# Patient Record
Sex: Female | Born: 1960 | Race: Black or African American | Hispanic: No | Marital: Single | Smoking: Former smoker
Health system: Southern US, Community
[De-identification: ages and names within clinical notes are randomized; demographics above are authoritative.]

## PROBLEM LIST (undated history)

## (undated) DIAGNOSIS — I10 Essential (primary) hypertension: Secondary | ICD-10-CM

## (undated) DIAGNOSIS — K8020 Calculus of gallbladder without cholecystitis without obstruction: Secondary | ICD-10-CM

## (undated) DIAGNOSIS — D649 Anemia, unspecified: Secondary | ICD-10-CM

## (undated) DIAGNOSIS — F410 Panic disorder [episodic paroxysmal anxiety] without agoraphobia: Secondary | ICD-10-CM

## (undated) DIAGNOSIS — E119 Type 2 diabetes mellitus without complications: Secondary | ICD-10-CM

## (undated) HISTORY — DX: Panic disorder (episodic paroxysmal anxiety): F41.0

## (undated) HISTORY — PX: OTHER SURGICAL HISTORY: SHX169

## (undated) HISTORY — DX: Anemia, unspecified: D64.9

---

## 1997-08-02 ENCOUNTER — Ambulatory Visit (HOSPITAL_COMMUNITY): Admission: RE | Admit: 1997-08-02 | Discharge: 1997-08-02 | Payer: Self-pay | Admitting: Emergency Medicine

## 1998-06-25 ENCOUNTER — Encounter: Payer: Self-pay | Admitting: Emergency Medicine

## 1998-06-25 ENCOUNTER — Emergency Department (HOSPITAL_COMMUNITY): Admission: EM | Admit: 1998-06-25 | Discharge: 1998-06-25 | Payer: Self-pay | Admitting: Emergency Medicine

## 1998-07-29 ENCOUNTER — Encounter: Payer: Self-pay | Admitting: Emergency Medicine

## 1998-07-29 ENCOUNTER — Emergency Department (HOSPITAL_COMMUNITY): Admission: EM | Admit: 1998-07-29 | Discharge: 1998-07-29 | Payer: Self-pay | Admitting: Emergency Medicine

## 1998-12-11 ENCOUNTER — Emergency Department (HOSPITAL_COMMUNITY): Admission: EM | Admit: 1998-12-11 | Discharge: 1998-12-12 | Payer: Self-pay | Admitting: Emergency Medicine

## 1998-12-12 ENCOUNTER — Encounter: Payer: Self-pay | Admitting: Emergency Medicine

## 2002-11-22 ENCOUNTER — Ambulatory Visit (HOSPITAL_COMMUNITY): Admission: RE | Admit: 2002-11-22 | Discharge: 2002-11-22 | Payer: Self-pay | Admitting: Family Medicine

## 2002-11-22 ENCOUNTER — Encounter: Payer: Self-pay | Admitting: Occupational Therapy

## 2003-02-04 ENCOUNTER — Ambulatory Visit (HOSPITAL_COMMUNITY): Admission: RE | Admit: 2003-02-04 | Discharge: 2003-02-04 | Payer: Self-pay | Admitting: Family Medicine

## 2003-02-04 ENCOUNTER — Encounter: Payer: Self-pay | Admitting: Family Medicine

## 2003-03-16 ENCOUNTER — Ambulatory Visit (HOSPITAL_COMMUNITY): Admission: RE | Admit: 2003-03-16 | Discharge: 2003-03-16 | Payer: Self-pay | Admitting: Family Medicine

## 2004-04-09 ENCOUNTER — Ambulatory Visit: Payer: Self-pay | Admitting: Family Medicine

## 2004-10-22 ENCOUNTER — Ambulatory Visit: Payer: Self-pay | Admitting: Family Medicine

## 2004-10-23 ENCOUNTER — Ambulatory Visit: Payer: Self-pay | Admitting: *Deleted

## 2004-12-01 ENCOUNTER — Ambulatory Visit: Payer: Self-pay | Admitting: Family Medicine

## 2004-12-03 ENCOUNTER — Ambulatory Visit: Payer: Self-pay | Admitting: Family Medicine

## 2004-12-29 ENCOUNTER — Ambulatory Visit: Payer: Self-pay | Admitting: Family Medicine

## 2004-12-31 ENCOUNTER — Ambulatory Visit (HOSPITAL_COMMUNITY): Admission: RE | Admit: 2004-12-31 | Discharge: 2004-12-31 | Payer: Self-pay | Admitting: Family Medicine

## 2005-04-22 ENCOUNTER — Ambulatory Visit: Payer: Self-pay | Admitting: Family Medicine

## 2005-07-22 ENCOUNTER — Ambulatory Visit: Payer: Self-pay | Admitting: Family Medicine

## 2005-11-12 ENCOUNTER — Ambulatory Visit: Payer: Self-pay | Admitting: Family Medicine

## 2006-01-10 ENCOUNTER — Encounter (INDEPENDENT_AMBULATORY_CARE_PROVIDER_SITE_OTHER): Payer: Self-pay | Admitting: Family Medicine

## 2006-01-10 LAB — CONVERTED CEMR LAB: Pap Smear: NORMAL

## 2006-01-31 ENCOUNTER — Ambulatory Visit: Payer: Self-pay | Admitting: Family Medicine

## 2006-02-02 ENCOUNTER — Ambulatory Visit (HOSPITAL_COMMUNITY): Admission: RE | Admit: 2006-02-02 | Discharge: 2006-02-02 | Payer: Self-pay | Admitting: Internal Medicine

## 2006-02-02 ENCOUNTER — Encounter (INDEPENDENT_AMBULATORY_CARE_PROVIDER_SITE_OTHER): Payer: Self-pay | Admitting: Family Medicine

## 2006-02-02 LAB — CONVERTED CEMR LAB: Pap Smear: NORMAL

## 2006-02-03 ENCOUNTER — Ambulatory Visit: Payer: Self-pay | Admitting: Family Medicine

## 2006-12-22 ENCOUNTER — Encounter (INDEPENDENT_AMBULATORY_CARE_PROVIDER_SITE_OTHER): Payer: Self-pay | Admitting: Family Medicine

## 2006-12-22 DIAGNOSIS — L259 Unspecified contact dermatitis, unspecified cause: Secondary | ICD-10-CM | POA: Insufficient documentation

## 2006-12-22 DIAGNOSIS — R252 Cramp and spasm: Secondary | ICD-10-CM | POA: Insufficient documentation

## 2006-12-22 DIAGNOSIS — K649 Unspecified hemorrhoids: Secondary | ICD-10-CM | POA: Insufficient documentation

## 2006-12-28 ENCOUNTER — Encounter (INDEPENDENT_AMBULATORY_CARE_PROVIDER_SITE_OTHER): Payer: Self-pay | Admitting: *Deleted

## 2007-01-31 ENCOUNTER — Telehealth (INDEPENDENT_AMBULATORY_CARE_PROVIDER_SITE_OTHER): Payer: Self-pay | Admitting: Family Medicine

## 2007-02-08 ENCOUNTER — Telehealth (INDEPENDENT_AMBULATORY_CARE_PROVIDER_SITE_OTHER): Payer: Self-pay | Admitting: *Deleted

## 2007-03-20 ENCOUNTER — Ambulatory Visit: Payer: Self-pay | Admitting: Family Medicine

## 2007-03-20 ENCOUNTER — Encounter (INDEPENDENT_AMBULATORY_CARE_PROVIDER_SITE_OTHER): Payer: Self-pay | Admitting: Family Medicine

## 2007-03-20 LAB — CONVERTED CEMR LAB
Bilirubin Urine: NEGATIVE
Blood in Urine, dipstick: NEGATIVE
Chlamydia, DNA Probe: NEGATIVE
GC Probe Amp, Genital: NEGATIVE
Glucose, Urine, Semiquant: NEGATIVE
Ketones, urine, test strip: NEGATIVE
Nitrite: NEGATIVE
Protein, U semiquant: NEGATIVE
Specific Gravity, Urine: 1.01
Urobilinogen, UA: NEGATIVE
WBC Urine, dipstick: NEGATIVE
pH: 5

## 2007-03-21 LAB — CONVERTED CEMR LAB: Pap Smear: NORMAL

## 2007-03-23 ENCOUNTER — Ambulatory Visit (HOSPITAL_COMMUNITY): Admission: RE | Admit: 2007-03-23 | Discharge: 2007-03-23 | Payer: Self-pay | Admitting: Family Medicine

## 2007-03-23 ENCOUNTER — Encounter (INDEPENDENT_AMBULATORY_CARE_PROVIDER_SITE_OTHER): Payer: Self-pay | Admitting: Family Medicine

## 2007-03-28 ENCOUNTER — Encounter (INDEPENDENT_AMBULATORY_CARE_PROVIDER_SITE_OTHER): Payer: Self-pay | Admitting: Family Medicine

## 2007-03-28 DIAGNOSIS — D509 Iron deficiency anemia, unspecified: Secondary | ICD-10-CM | POA: Insufficient documentation

## 2007-03-28 LAB — CONVERTED CEMR LAB
ALT: 14 units/L (ref 0–35)
AST: 14 units/L (ref 0–37)
Albumin: 3.9 g/dL (ref 3.5–5.2)
Alkaline Phosphatase: 61 units/L (ref 39–117)
BUN: 13 mg/dL (ref 6–23)
Basophils Absolute: 0 10*3/uL (ref 0.0–0.1)
Basophils Relative: 0 % (ref 0–1)
CO2: 25 meq/L (ref 19–32)
Calcium: 9.6 mg/dL (ref 8.4–10.5)
Chloride: 102 meq/L (ref 96–112)
Cholesterol: 194 mg/dL (ref 0–200)
Creatinine, Ser: 0.89 mg/dL (ref 0.40–1.20)
Eosinophils Absolute: 0.1 10*3/uL — ABNORMAL LOW (ref 0.2–0.7)
Eosinophils Relative: 1 % (ref 0–5)
Glucose, Bld: 78 mg/dL (ref 70–99)
HCT: 35.7 % — ABNORMAL LOW (ref 36.0–46.0)
HDL: 76 mg/dL (ref >39)
Hemoglobin: 11.1 g/dL — ABNORMAL LOW (ref 12.0–15.0)
LDL Cholesterol: 82 mg/dL (ref 0–99)
Lymphocytes Relative: 54 % — ABNORMAL HIGH (ref 12–46)
Lymphs Abs: 4.4 10*3/uL — ABNORMAL HIGH (ref 0.7–4.0)
MCHC: 31.1 g/dL (ref 30.0–36.0)
MCV: 87.7 fL (ref 78.0–100.0)
Monocytes Absolute: 0.4 10*3/uL (ref 0.1–1.0)
Monocytes Relative: 5 % (ref 3–12)
Neutro Abs: 3.3 10*3/uL (ref 1.7–7.7)
Neutrophils Relative %: 40 % — ABNORMAL LOW (ref 43–77)
Platelets: 345 10*3/uL (ref 150–400)
Potassium: 3.8 meq/L (ref 3.5–5.3)
RBC: 4.07 M/uL (ref 3.87–5.11)
RDW: 13.6 % (ref 11.5–15.5)
Sodium: 138 meq/L (ref 135–145)
TSH: 3.596 microintl units/mL (ref 0.350–5.50)
Total Bilirubin: 0.3 mg/dL (ref 0.3–1.2)
Total CHOL/HDL Ratio: 2.6
Total Protein: 7.6 g/dL (ref 6.0–8.3)
Triglycerides: 179 mg/dL — ABNORMAL HIGH (ref <150)
VLDL: 36 mg/dL (ref 0–40)
WBC: 8.3 10*3/uL (ref 4.0–10.5)

## 2007-04-23 ENCOUNTER — Encounter (INDEPENDENT_AMBULATORY_CARE_PROVIDER_SITE_OTHER): Payer: Self-pay | Admitting: Family Medicine

## 2007-08-14 ENCOUNTER — Telehealth (INDEPENDENT_AMBULATORY_CARE_PROVIDER_SITE_OTHER): Payer: Self-pay | Admitting: Family Medicine

## 2007-10-02 ENCOUNTER — Telehealth (INDEPENDENT_AMBULATORY_CARE_PROVIDER_SITE_OTHER): Payer: Self-pay | Admitting: Family Medicine

## 2008-02-07 ENCOUNTER — Ambulatory Visit: Payer: Self-pay | Admitting: Nurse Practitioner

## 2008-02-07 DIAGNOSIS — R209 Unspecified disturbances of skin sensation: Secondary | ICD-10-CM | POA: Insufficient documentation

## 2008-02-07 DIAGNOSIS — I10 Essential (primary) hypertension: Secondary | ICD-10-CM

## 2008-02-08 ENCOUNTER — Ambulatory Visit (HOSPITAL_COMMUNITY): Admission: RE | Admit: 2008-02-08 | Discharge: 2008-02-08 | Payer: Self-pay | Admitting: Family Medicine

## 2008-02-13 ENCOUNTER — Ambulatory Visit (HOSPITAL_COMMUNITY): Admission: RE | Admit: 2008-02-13 | Discharge: 2008-02-13 | Payer: Self-pay | Admitting: Internal Medicine

## 2008-02-13 DIAGNOSIS — I6789 Other cerebrovascular disease: Secondary | ICD-10-CM

## 2008-02-19 ENCOUNTER — Telehealth (INDEPENDENT_AMBULATORY_CARE_PROVIDER_SITE_OTHER): Payer: Self-pay | Admitting: Nurse Practitioner

## 2008-02-21 ENCOUNTER — Ambulatory Visit: Payer: Self-pay | Admitting: Nurse Practitioner

## 2008-02-23 ENCOUNTER — Ambulatory Visit: Payer: Self-pay | Admitting: Family Medicine

## 2008-02-28 ENCOUNTER — Ambulatory Visit: Payer: Self-pay | Admitting: Vascular Surgery

## 2008-02-28 ENCOUNTER — Encounter (INDEPENDENT_AMBULATORY_CARE_PROVIDER_SITE_OTHER): Payer: Self-pay | Admitting: Family Medicine

## 2008-02-28 ENCOUNTER — Ambulatory Visit (HOSPITAL_COMMUNITY): Admission: RE | Admit: 2008-02-28 | Discharge: 2008-02-28 | Payer: Self-pay | Admitting: Family Medicine

## 2008-02-28 ENCOUNTER — Encounter (INDEPENDENT_AMBULATORY_CARE_PROVIDER_SITE_OTHER): Payer: Self-pay | Admitting: Ophthalmology

## 2008-03-06 ENCOUNTER — Ambulatory Visit: Payer: Self-pay | Admitting: Nurse Practitioner

## 2008-03-11 ENCOUNTER — Telehealth (INDEPENDENT_AMBULATORY_CARE_PROVIDER_SITE_OTHER): Payer: Self-pay | Admitting: Family Medicine

## 2008-03-13 ENCOUNTER — Telehealth (INDEPENDENT_AMBULATORY_CARE_PROVIDER_SITE_OTHER): Payer: Self-pay | Admitting: Nurse Practitioner

## 2008-04-01 ENCOUNTER — Ambulatory Visit: Payer: Self-pay | Admitting: Family Medicine

## 2008-04-01 DIAGNOSIS — I10 Essential (primary) hypertension: Secondary | ICD-10-CM | POA: Insufficient documentation

## 2008-04-01 LAB — CONVERTED CEMR LAB
ALT: 15 units/L (ref 0–35)
AST: 17 units/L (ref 0–37)
Albumin: 4 g/dL (ref 3.5–5.2)
Alkaline Phosphatase: 59 units/L (ref 39–117)
BUN: 19 mg/dL (ref 6–23)
Basophils Absolute: 0 10*3/uL (ref 0.0–0.1)
Basophils Relative: 0 % (ref 0–1)
CO2: 23 meq/L (ref 19–32)
Calcium: 9.6 mg/dL (ref 8.4–10.5)
Chloride: 98 meq/L (ref 96–112)
Cholesterol: 204 mg/dL — ABNORMAL HIGH (ref 0–200)
Creatinine, Ser: 1.18 mg/dL (ref 0.40–1.20)
Eosinophils Absolute: 0.2 10*3/uL (ref 0.0–0.7)
Eosinophils Relative: 3 % (ref 0–5)
Glucose, Bld: 110 mg/dL — ABNORMAL HIGH (ref 70–99)
HCT: 35.2 % — ABNORMAL LOW (ref 36.0–46.0)
HDL: 69 mg/dL (ref >39)
Hemoglobin: 11.1 g/dL — ABNORMAL LOW (ref 12.0–15.0)
LDL Cholesterol: 83 mg/dL (ref 0–99)
Lymphocytes Relative: 48 % — ABNORMAL HIGH (ref 12–46)
Lymphs Abs: 2.8 10*3/uL (ref 0.7–4.0)
MCHC: 31.5 g/dL (ref 30.0–36.0)
MCV: 87.3 fL (ref 78.0–100.0)
Monocytes Absolute: 0.4 10*3/uL (ref 0.1–1.0)
Monocytes Relative: 7 % (ref 3–12)
Neutro Abs: 2.5 10*3/uL (ref 1.7–7.7)
Neutrophils Relative %: 42 % — ABNORMAL LOW (ref 43–77)
Platelets: 356 10*3/uL (ref 150–400)
Potassium: 4.1 meq/L (ref 3.5–5.3)
RBC: 4.03 M/uL (ref 3.87–5.11)
RDW: 13 % (ref 11.5–15.5)
Sodium: 137 meq/L (ref 135–145)
TSH: 1.315 microintl units/mL (ref 0.350–4.50)
Total Bilirubin: 0.3 mg/dL (ref 0.3–1.2)
Total CHOL/HDL Ratio: 3
Total Protein: 7.8 g/dL (ref 6.0–8.3)
Triglycerides: 261 mg/dL — ABNORMAL HIGH (ref <150)
VLDL: 52 mg/dL — ABNORMAL HIGH (ref 0–40)
WBC: 5.9 10*3/uL (ref 4.0–10.5)

## 2008-04-02 ENCOUNTER — Telehealth (INDEPENDENT_AMBULATORY_CARE_PROVIDER_SITE_OTHER): Payer: Self-pay | Admitting: *Deleted

## 2008-04-03 ENCOUNTER — Telehealth (INDEPENDENT_AMBULATORY_CARE_PROVIDER_SITE_OTHER): Payer: Self-pay | Admitting: Nurse Practitioner

## 2008-04-10 ENCOUNTER — Ambulatory Visit (HOSPITAL_COMMUNITY): Admission: RE | Admit: 2008-04-10 | Discharge: 2008-04-10 | Payer: Self-pay | Admitting: Family Medicine

## 2008-04-10 ENCOUNTER — Ambulatory Visit: Payer: Self-pay | Admitting: Cardiology

## 2008-04-10 ENCOUNTER — Encounter (INDEPENDENT_AMBULATORY_CARE_PROVIDER_SITE_OTHER): Payer: Self-pay | Admitting: Ophthalmology

## 2008-04-30 ENCOUNTER — Ambulatory Visit: Payer: Self-pay | Admitting: Family Medicine

## 2008-04-30 ENCOUNTER — Encounter (INDEPENDENT_AMBULATORY_CARE_PROVIDER_SITE_OTHER): Payer: Self-pay | Admitting: Family Medicine

## 2008-04-30 LAB — CONVERTED CEMR LAB
Bilirubin Urine: NEGATIVE
Blood in Urine, dipstick: NEGATIVE
Glucose, Urine, Semiquant: NEGATIVE
Ketones, urine, test strip: NEGATIVE
Nitrite: NEGATIVE
Protein, U semiquant: NEGATIVE
Specific Gravity, Urine: 1.01
Urobilinogen, UA: 1
pH: 7

## 2008-05-01 ENCOUNTER — Encounter (INDEPENDENT_AMBULATORY_CARE_PROVIDER_SITE_OTHER): Payer: Self-pay | Admitting: Family Medicine

## 2008-05-01 LAB — CONVERTED CEMR LAB
Anti Nuclear Antibody(ANA): NEGATIVE
AntiThromb III Func: 109 % (ref 76–126)
Anticardiolipin IgA: <7 (ref <13)
Anticardiolipin IgG: <7 (ref <11)
Anticardiolipin IgM: <7 (ref <10)
Homocysteine: 9.2 micromoles/L (ref 4.0–15.4)

## 2008-05-03 ENCOUNTER — Telehealth (INDEPENDENT_AMBULATORY_CARE_PROVIDER_SITE_OTHER): Payer: Self-pay | Admitting: Family Medicine

## 2008-05-07 ENCOUNTER — Ambulatory Visit (HOSPITAL_COMMUNITY): Admission: RE | Admit: 2008-05-07 | Discharge: 2008-05-07 | Payer: Self-pay | Admitting: Family Medicine

## 2008-05-08 ENCOUNTER — Ambulatory Visit: Payer: Self-pay | Admitting: Family Medicine

## 2008-05-09 ENCOUNTER — Encounter (INDEPENDENT_AMBULATORY_CARE_PROVIDER_SITE_OTHER): Payer: Self-pay | Admitting: Family Medicine

## 2008-05-09 LAB — CONVERTED CEMR LAB: Protein S Ag, Total: 106 % (ref 70–140)

## 2008-05-14 ENCOUNTER — Encounter (INDEPENDENT_AMBULATORY_CARE_PROVIDER_SITE_OTHER): Payer: Self-pay | Admitting: Family Medicine

## 2008-07-04 ENCOUNTER — Ambulatory Visit: Payer: Self-pay | Admitting: Obstetrics and Gynecology

## 2008-08-13 ENCOUNTER — Ambulatory Visit: Payer: Self-pay | Admitting: Family Medicine

## 2008-12-26 ENCOUNTER — Ambulatory Visit: Payer: Self-pay | Admitting: Internal Medicine

## 2008-12-26 ENCOUNTER — Encounter (INDEPENDENT_AMBULATORY_CARE_PROVIDER_SITE_OTHER): Payer: Self-pay | Admitting: *Deleted

## 2008-12-26 DIAGNOSIS — E669 Obesity, unspecified: Secondary | ICD-10-CM

## 2008-12-26 LAB — CONVERTED CEMR LAB
Bilirubin Urine: NEGATIVE
Blood in Urine, dipstick: NEGATIVE
Glucose, Urine, Semiquant: NEGATIVE
Ketones, urine, test strip: NEGATIVE
Nitrite: NEGATIVE
Protein, U semiquant: NEGATIVE
Specific Gravity, Urine: >=1.03
Urobilinogen, UA: 0.2
WBC Urine, dipstick: NEGATIVE
pH: 5

## 2008-12-27 ENCOUNTER — Ambulatory Visit: Payer: Self-pay | Admitting: Internal Medicine

## 2008-12-30 ENCOUNTER — Ambulatory Visit: Payer: Self-pay | Admitting: Nurse Practitioner

## 2009-01-05 ENCOUNTER — Telehealth (INDEPENDENT_AMBULATORY_CARE_PROVIDER_SITE_OTHER): Payer: Self-pay | Admitting: Internal Medicine

## 2009-01-08 LAB — CONVERTED CEMR LAB
ALT: 22 units/L (ref 0–35)
AST: 23 units/L (ref 0–37)
Albumin: 4.1 g/dL (ref 3.5–5.2)
Alkaline Phosphatase: 66 units/L (ref 39–117)
BUN: 13 mg/dL (ref 6–23)
Basophils Absolute: 0 10*3/uL (ref 0.0–0.1)
Basophils Relative: 1 % (ref 0–1)
CO2: 21 meq/L (ref 19–32)
Calcium: 9.3 mg/dL (ref 8.4–10.5)
Chloride: 101 meq/L (ref 96–112)
Cholesterol: 173 mg/dL (ref 0–200)
Creatinine, Ser: 0.99 mg/dL (ref 0.40–1.20)
Eosinophils Absolute: 0.1 10*3/uL (ref 0.0–0.7)
Eosinophils Relative: 2 % (ref 0–5)
Glucose, Bld: 89 mg/dL (ref 70–99)
HCT: 36 % (ref 36.0–46.0)
HDL: 59 mg/dL (ref >39)
Hemoglobin: 11.4 g/dL — ABNORMAL LOW (ref 12.0–15.0)
LDL Cholesterol: 87 mg/dL (ref 0–99)
Lymphocytes Relative: 45 % (ref 12–46)
Lymphs Abs: 2.4 10*3/uL (ref 0.7–4.0)
MCHC: 31.7 g/dL (ref 30.0–36.0)
MCV: 92.8 fL (ref 78.0–100.0)
Monocytes Absolute: 0.3 10*3/uL (ref 0.1–1.0)
Monocytes Relative: 5 % (ref 3–12)
Neutro Abs: 2.6 10*3/uL (ref 1.7–7.7)
Neutrophils Relative %: 47 % (ref 43–77)
Platelets: 301 10*3/uL (ref 150–400)
Potassium: 3.6 meq/L (ref 3.5–5.3)
RBC: 3.88 M/uL (ref 3.87–5.11)
RDW: 13.7 % (ref 11.5–15.5)
Sodium: 140 meq/L (ref 135–145)
Total Bilirubin: 0.4 mg/dL (ref 0.3–1.2)
Total CHOL/HDL Ratio: 2.9
Total Protein: 7.4 g/dL (ref 6.0–8.3)
Triglycerides: 137 mg/dL (ref <150)
VLDL: 27 mg/dL (ref 0–40)
WBC: 5.4 10*3/uL (ref 4.0–10.5)

## 2009-03-03 ENCOUNTER — Telehealth (INDEPENDENT_AMBULATORY_CARE_PROVIDER_SITE_OTHER): Payer: Self-pay | Admitting: Internal Medicine

## 2009-04-11 ENCOUNTER — Emergency Department (HOSPITAL_COMMUNITY): Admission: EM | Admit: 2009-04-11 | Discharge: 2009-04-11 | Payer: Self-pay | Admitting: Emergency Medicine

## 2009-04-11 DIAGNOSIS — J13 Pneumonia due to Streptococcus pneumoniae: Secondary | ICD-10-CM | POA: Insufficient documentation

## 2009-04-14 ENCOUNTER — Ambulatory Visit: Payer: Self-pay | Admitting: Nurse Practitioner

## 2009-04-19 ENCOUNTER — Ambulatory Visit (HOSPITAL_COMMUNITY): Admission: RE | Admit: 2009-04-19 | Discharge: 2009-04-19 | Payer: Self-pay | Admitting: Internal Medicine

## 2009-05-01 ENCOUNTER — Ambulatory Visit: Payer: Self-pay | Admitting: Internal Medicine

## 2009-05-01 DIAGNOSIS — B373 Candidiasis of vulva and vagina: Secondary | ICD-10-CM

## 2009-05-01 LAB — CONVERTED CEMR LAB
KOH Prep: NEGATIVE
Pap Smear: NEGATIVE
Rapid HIV Screen: NEGATIVE
Whiff Test: NEGATIVE

## 2009-05-15 LAB — CONVERTED CEMR LAB
ALT: 16 units/L (ref 0–35)
AST: 19 units/L (ref 0–37)
Albumin: 4.1 g/dL (ref 3.5–5.2)
Alkaline Phosphatase: 71 units/L (ref 39–117)
BUN: 16 mg/dL (ref 6–23)
Basophils Absolute: 0 10*3/uL (ref 0.0–0.1)
Basophils Relative: 0 % (ref 0–1)
CO2: 24 meq/L (ref 19–32)
Calcium: 9.7 mg/dL (ref 8.4–10.5)
Chlamydia, DNA Probe: NEGATIVE
Chloride: 104 meq/L (ref 96–112)
Cholesterol: 171 mg/dL (ref 0–200)
Creatinine, Ser: 0.84 mg/dL (ref 0.40–1.20)
Eosinophils Absolute: 0.2 10*3/uL (ref 0.0–0.7)
Eosinophils Relative: 4 % (ref 0–5)
GC Probe Amp, Genital: NEGATIVE
Glucose, Bld: 99 mg/dL (ref 70–99)
HCT: 33.7 % — ABNORMAL LOW (ref 36.0–46.0)
HDL: 59 mg/dL (ref >39)
Hemoglobin: 10.1 g/dL — ABNORMAL LOW (ref 12.0–15.0)
LDL Cholesterol: 84 mg/dL (ref 0–99)
Lymphocytes Relative: 59 % — ABNORMAL HIGH (ref 12–46)
Lymphs Abs: 2.9 10*3/uL (ref 0.7–4.0)
MCHC: 30 g/dL (ref 30.0–36.0)
MCV: 91.3 fL (ref 78.0–100.0)
Monocytes Absolute: 0.4 10*3/uL (ref 0.1–1.0)
Monocytes Relative: 8 % (ref 3–12)
Neutro Abs: 1.4 10*3/uL — ABNORMAL LOW (ref 1.7–7.7)
Neutrophils Relative %: 29 % — ABNORMAL LOW (ref 43–77)
Platelets: 299 10*3/uL (ref 150–400)
Potassium: 4.7 meq/L (ref 3.5–5.3)
RBC: 3.69 M/uL — ABNORMAL LOW (ref 3.87–5.11)
RDW: 14.6 % (ref 11.5–15.5)
Sodium: 140 meq/L (ref 135–145)
Total Bilirubin: 0.3 mg/dL (ref 0.3–1.2)
Total CHOL/HDL Ratio: 2.9
Total Protein: 7.2 g/dL (ref 6.0–8.3)
Triglycerides: 140 mg/dL (ref <150)
VLDL: 28 mg/dL (ref 0–40)
WBC: 4.9 10*3/uL (ref 4.0–10.5)

## 2009-05-21 ENCOUNTER — Ambulatory Visit (HOSPITAL_COMMUNITY): Admission: RE | Admit: 2009-05-21 | Discharge: 2009-05-21 | Payer: Self-pay | Admitting: Internal Medicine

## 2009-05-27 ENCOUNTER — Encounter (INDEPENDENT_AMBULATORY_CARE_PROVIDER_SITE_OTHER): Payer: Self-pay | Admitting: *Deleted

## 2009-06-04 ENCOUNTER — Telehealth (INDEPENDENT_AMBULATORY_CARE_PROVIDER_SITE_OTHER): Payer: Self-pay | Admitting: Internal Medicine

## 2009-06-13 ENCOUNTER — Ambulatory Visit: Payer: Self-pay | Admitting: Internal Medicine

## 2009-06-13 LAB — CONVERTED CEMR LAB
Folate: >20 ng/mL
Iron: 68 ug/dL (ref 42–145)
Saturation Ratios: 24 % (ref 20–55)
TIBC: 289 ug/dL (ref 250–470)
UIBC: 221 ug/dL
Vitamin B-12: 818 pg/mL (ref 211–911)

## 2009-06-25 ENCOUNTER — Encounter (INDEPENDENT_AMBULATORY_CARE_PROVIDER_SITE_OTHER): Payer: Self-pay | Admitting: *Deleted

## 2009-10-28 ENCOUNTER — Ambulatory Visit: Payer: Self-pay | Admitting: Internal Medicine

## 2009-10-30 ENCOUNTER — Ambulatory Visit (HOSPITAL_COMMUNITY): Admission: RE | Admit: 2009-10-30 | Discharge: 2009-10-30 | Payer: Self-pay | Admitting: Internal Medicine

## 2009-10-31 ENCOUNTER — Telehealth (INDEPENDENT_AMBULATORY_CARE_PROVIDER_SITE_OTHER): Payer: Self-pay | Admitting: Internal Medicine

## 2009-12-18 ENCOUNTER — Ambulatory Visit: Payer: Self-pay | Admitting: Internal Medicine

## 2010-02-25 LAB — CONVERTED CEMR LAB
ALT: 24 units/L (ref 0–35)
AST: 22 units/L (ref 0–37)
Albumin: 4.2 g/dL (ref 3.5–5.2)
Alkaline Phosphatase: 65 units/L (ref 39–117)
Basophils Absolute: 0 10*3/uL (ref 0.0–0.1)
Basophils Relative: 1 % (ref 0–1)
Bilirubin, Direct: 0.1 mg/dL (ref 0.0–0.3)
Cholesterol: 151 mg/dL (ref 0–200)
Eosinophils Absolute: 0.1 10*3/uL (ref 0.0–0.7)
Eosinophils Relative: 2 % (ref 0–5)
HCT: 35 % — ABNORMAL LOW (ref 36.0–46.0)
HDL: 49 mg/dL (ref >39)
Hemoglobin: 10.8 g/dL — ABNORMAL LOW (ref 12.0–15.0)
Indirect Bilirubin: 0.4 mg/dL (ref 0.0–0.9)
LDL Cholesterol: 60 mg/dL (ref 0–99)
Lymphocytes Relative: 52 % — ABNORMAL HIGH (ref 12–46)
Lymphs Abs: 2.3 10*3/uL (ref 0.7–4.0)
MCHC: 30.9 g/dL (ref 30.0–36.0)
MCV: 90.4 fL (ref 78.0–100.0)
Monocytes Absolute: 0.3 10*3/uL (ref 0.1–1.0)
Monocytes Relative: 6 % (ref 3–12)
Neutro Abs: 1.8 10*3/uL (ref 1.7–7.7)
Neutrophils Relative %: 40 % — ABNORMAL LOW (ref 43–77)
Platelets: 249 10*3/uL (ref 150–400)
RBC: 3.87 M/uL (ref 3.87–5.11)
RDW: 14.6 % (ref 11.5–15.5)
Total Bilirubin: 0.5 mg/dL (ref 0.3–1.2)
Total CHOL/HDL Ratio: 3.1
Total Protein: 7.3 g/dL (ref 6.0–8.3)
Triglycerides: 208 mg/dL — ABNORMAL HIGH (ref <150)
VLDL: 42 mg/dL — ABNORMAL HIGH (ref 0–40)
WBC: 4.4 10*3/uL (ref 4.0–10.5)

## 2010-04-14 ENCOUNTER — Encounter (INDEPENDENT_AMBULATORY_CARE_PROVIDER_SITE_OTHER): Payer: Self-pay | Admitting: Internal Medicine

## 2010-04-14 ENCOUNTER — Ambulatory Visit
Admission: RE | Admit: 2010-04-14 | Discharge: 2010-04-14 | Payer: Self-pay | Source: Home / Self Care | Attending: Internal Medicine | Admitting: Internal Medicine

## 2010-04-14 LAB — CONVERTED CEMR LAB
Basophils Absolute: 0 10*3/uL (ref 0.0–0.1)
CO2: 29 meq/L (ref 19–32)
Chlamydia, DNA Probe: NEGATIVE
Chloride: 100 meq/L (ref 96–112)
GC Probe Amp, Genital: NEGATIVE
HDL: 40 mg/dL (ref >39)
HIV: NONREACTIVE
Hemoglobin: 10.2 g/dL — ABNORMAL LOW (ref 12.0–15.0)
KOH Prep: NEGATIVE
LDL Cholesterol: 66 mg/dL (ref 0–99)
Lymphocytes Relative: 45 % (ref 12–46)
Lymphs Abs: 2.7 10*3/uL (ref 0.7–4.0)
Monocytes Absolute: 0.4 10*3/uL (ref 0.1–1.0)
Neutro Abs: 2.8 10*3/uL (ref 1.7–7.7)
Platelets: 262 10*3/uL (ref 150–400)
RDW: 14.7 % (ref 11.5–15.5)
Sodium: 138 meq/L (ref 135–145)
Total CHOL/HDL Ratio: 3.5
WBC: 6 10*3/uL (ref 4.0–10.5)
Whiff Test: NEGATIVE

## 2010-04-29 ENCOUNTER — Ambulatory Visit
Admission: RE | Admit: 2010-04-29 | Discharge: 2010-04-29 | Payer: Self-pay | Source: Home / Self Care | Attending: Internal Medicine | Admitting: Internal Medicine

## 2010-05-01 ENCOUNTER — Other Ambulatory Visit (HOSPITAL_COMMUNITY): Payer: Self-pay | Admitting: Internal Medicine

## 2010-05-01 DIAGNOSIS — Z1231 Encounter for screening mammogram for malignant neoplasm of breast: Secondary | ICD-10-CM

## 2010-05-01 DIAGNOSIS — Z0000 Encounter for general adult medical examination without abnormal findings: Secondary | ICD-10-CM

## 2010-05-02 ENCOUNTER — Encounter: Payer: Self-pay | Admitting: Family Medicine

## 2010-05-04 ENCOUNTER — Telehealth (INDEPENDENT_AMBULATORY_CARE_PROVIDER_SITE_OTHER): Payer: Self-pay | Admitting: Internal Medicine

## 2010-05-12 NOTE — Progress Notes (Signed)
Summary: Office Visit/DEPRESSION SCREENING  Office Visit/DEPRESSION SCREENING   Imported By: Arta Bruce 06/06/2009 12:02:32  _____________________________________________________________________  External Attachment:    Type:   Image     Comment:   External Document

## 2010-05-12 NOTE — Letter (Signed)
Summary: *HSN Results Follow up  [facility]  [address] [place], [place] [address]   Phone: [phone]  Fax: [phone]      06/25/2009   Erika Nielsen [address] [place], [place]  [address]   Dear  [name],                            ____S.Drinkard,FNP   ____D. Gore,FNP       ____B. McPherson,MD   ____V. Rankins,MD    __x__E. Mulberry,MD    ____N. Daphine Deutscher, FNP  ____D. Reche Dixon, MD    ____K. Philipp Deputy, MD    ____Other     This letter is to inform you that your recent test(s):  _______Pap Smear    _______Lab Test     _______X-ray    _______ is within acceptable limits  _______ requires a medication change  _______ requires a follow-up lab visit  _______ requires a follow-up visit with your provider   Comments:  We have tried to reach you several times, please give our office a call at your earliest convience.  Thank you.       _________________________________________________________ If you have any questions, please contact our office                     Sincerely,  Vesta Mixer CMA [facility]

## 2010-05-12 NOTE — Letter (Signed)
Summary: *Referral Letter  [facility]  [address] [place], [place] [address]   Phone: [phone]  Fax: [phone]    05/01/2009  Thank you in advance for agreeing to see my patient:  Erika Nielsen [address] [place], [place]  [address]  Phone: [phone]  Reason for Referral: Possible mass in posterior rectum at tip of digital exam--felt at least submucosal.  Erika Nielsen had difficulties complying with exam--made her very uncomfortable.  Stool was Guaiac negative.  Procedures Requested: ?Flexible sigmoidoscopy  Current Medical Problems: 1)  POSSIBLE RECTAL MASS (ICD-787.99) 2)  HEALTH MAINTENANCE EXAM (ICD-V70.0) 3)  ROUTINE GYNECOLOGICAL EXAMINATION (ICD-V72.31) 4)  PNEUMONIA, COMMUNITY ACQUIRED, PNEUMOCOCCAL (ICD-481) 5)  TOBACCO ABUSE (ICD-305.1) 6)  OBESITY (ICD-278.00) 7)  ENCOUNTER FOR LONG-TERM USE OF OTHER MEDICATIONS (ICD-V58.69) 8)  CONTRACEPTIVE MANAGEMENT (ICD-V25.09) 9)  HYPERLIPIDEMIA (ICD-272.4) 10)  CEREBROVASCULAR DISEASE, ISCHEMIC (ICD-437.1) 11)  HYPERTENSION, BENIGN ESSENTIAL (ICD-401.1) 12)  FACIAL PARESTHESIA, LEFT (ICD-782.0) 13)  ANEMIA, IRON DEFICIENCY (ICD-280.9) 14)  EXAMINATION, ROUTINE MEDICAL (ICD-V70.0) 15)  Hx of MUSCLE CRAMPS (ICD-729.82) 16)  ECZEMA (ICD-692.9) 17)  HEMORRHOIDS (ICD-455.6)   Current Medications: 1)  HYDROCHLOROTHIAZIDE 25 MG TABS (HYDROCHLOROTHIAZIDE) Take one (1) by mouth daily 2)  CRESTOR 5 MG  TABS (ROSUVASTATIN CALCIUM) Take 1 tab by mouth daily 3)  PLAVIX 75 MG TABS (CLOPIDOGREL BISULFATE) Take 1 tab by mouth daily 4)  PRENATAL MULTIVIT-IRON  TABS (PRENATAL VIT-FE SULFATE-FA) Take 1 tablet by mouth once a day 5)  BETAMETHASONE DIPROPIONATE 0.05 % OINT (BETAMETHASONE DIPROPIONATE) Apply two times a day to affected areas prn 6)  ASPIRIN 325 MG TABS (ASPIRIN) 1 tab by mouth daily   Past Medical History: 1)  h/o left knee pain 2)  eczema 3)  HTN 4)  Hyperlipidemia 5)  CVA  10/09...hypercoaguable w/u was negative.No residual side effects. 6)  Smoker   Prior History of Blood Transfusions:   Pertinent Labs:    Thank you again for agreeing to see our patient; please contact us if you have any further questions or need additional information.  Sincerely,  Julieanne Manson MD

## 2010-05-12 NOTE — Progress Notes (Signed)
Summary: needs lab appt. and anoscopy  Phone Note Outgoing Call   Summary of Call: Please call pt.--pharmacy notified me that pt. hasn not picked up Crestor since 08/15/09--need to cancel FLP and liver enzymes already scheduled.  I do want her to come in for CBC.   Let her know she needs to never run out of her meds--needs to pick up Crestor and take every day.  Would like to reschedule FLP and liver profile in 8 weeks please. Let her know her CT was basically fine--I am planning to speak with radiology about it, however, and will get back to her with final information Initial call taken by: Julieanne Manson MD,  October 31, 2009 9:50 AM  Follow-up for Phone Call        Left message on answer machine for pt. to return call. Gaylyn Cheers RN  November 04, 2009 11:33 AM      Additional Follow-up for Phone Call Additional follow up Details #1::        SPOKE WITH MS [place] AND SHE SAYS THAT SHE ALREADY HAD CRESTOR AT HOME THAT SHE HAD NOT FINISHED AND SHE HAD PICKED UP SOME SINCE MAY. WE CANCELLED THE AUGUST 16 AND MOVED IT TO 09/06. Additional Follow-up by: Leodis Rains,  November 04, 2009 2:58 PM    Additional Follow-up for Phone Call Additional follow up Details #2::    See append of CT --spoke with radiology --no concerning findings in area of concern--want pt. to come in for anoscopy Follow-up by: Julieanne Manson MD,  November 09, 2009 6:48 AM

## 2010-05-12 NOTE — Assessment & Plan Note (Signed)
Summary: F/u ER visit - Community Aquired Pneumonia   Vital Signs:  Patient profile:   50 year old female Menstrual status:  perimenopausal Height:      63 inches Weight:      226.2 pounds BMI:     40.21 BSA:     2.04 O2 Sat:      93 % on Room air Temp:     98.5 degrees F oral Pulse rate:   83 / minute Pulse rhythm:   regular Resp:     20 per minute BP sitting:   142 / 86  (left arm) Cuff size:   large  Vitals Entered By: Arthor Captain (April 14, 2009 9:56 AM)  O2 Flow:  Room air CC: Follow-up visit, ED pneumonia Is Patient Diabetic? No Pain Assessment Patient in pain? yes     Location:  Rt chest Intensity: 7 Type: sharp Onset of pain  With activity, and coughing  Does patient need assistance? Functional Status Self care Ambulation Impaired:Risk for fall LMP - Character: normal     Menstrual Status perimenopausal Last PAP Result NEGATIVE FOR INTRAEPITHELIAL LESIONS OR MALIGNANCY.   CC:  Follow-up visit and ED pneumonia.  History of Present Illness:  Pt into the office for ER visit on 03/24/2009 She was Dx with community acquired pneumonia. Prior to presentation to the ER pt c/o fatigue, sweating, cough.  ER visit reviewed. CXRAY confirmed Dx. Walker today in office - fatigue and weakness makes pt feel unsteady.  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  Habits & Providers  Alcohol-Tobacco-Diet     Alcohol type: alcohol     Tobacco Status: quit     Tobacco Counseling: to remain off tobacco products     Year Started: 1983     Year Quit: 03/2009  Exercise-Depression-Behavior     Does Patient Exercise: yes     Have you felt down or hopeless? no     Have you felt little pleasure in things? no  Depression Counseling: further diagnostic testing and/or other treatment is indicated     Drug Use: no     Seat Belt Use: 100  Allergies (verified): No Known Drug Allergies  Social History: Smoking Status:  quit  Review of Systems CV:  Complains of chest pain or discomfort; right side which has improved with cough meds. Resp:  Complains of cough, shortness of breath, and wheezing; improving  with the medications. GI:  Denies abdominal pain, nausea, and vomiting.  Physical Exam  General:  alert.   Head:  normocephalic.   Lungs:  right lower lobe - rhonchi left lobe - CTA Heart:  normal rate and regular rhythm.   Abdomen:  normal bowel sounds.   Neurologic:  walker   Impression & Recommendations:  Problem # 1:  PNEUMONIA, COMMUNITY ACQUIRED, PNEUMOCOCCAL (ICD-481) advised pt to complete antibiotics as ordered will order cough med as needed  continue pain meds as per ER will order repeat CXRAY over the weekend out of work until 04/21/2009 - letter given Her updated medication list for this problem includes:    Doxycycline Hyclate 100 Mg Tabs (Doxycycline hyclate) ..... One tablet by mouth two times a day for infection **rx by  er**  Orders: Pulse Oximetry (single measurment) (94760) CXR- 2view (CXR)  Problem # 2:  TOBACCO ABUSE (ICD-305.1) quit as of 04/11/2009 advised continued cessation advised pt that CPE is due - she will schedule after current illness. she was advised to get fasting labs at that visit  Complete Medication List: 1)  Naproxen 500 Mg Tabs (Naproxen) .... Take one tablet by mouth every 12 hours as need for pain 2)  Hydrochlorothiazide 25 Mg Tabs (Hydrochlorothiazide) .... Take one (1) by mouth daily 3)  Crestor 5 Mg Tabs (Rosuvastatin calcium) .... Take 1 tab by mouth daily 4)  Plavix 75 Mg Tabs (Clopidogrel bisulfate) .... Take 1 tab by mouth daily 5)  Prenatal Multivit-iron Tabs (Prenatal vit-fe sulfate-fa) .... Take 1 tablet by mouth once a day 6)   Betamethasone Dipropionate 0.05 % Oint (Betamethasone dipropionate) .... Apply two times a day to affected areas prn 7)  Hydrocodone-acetaminophen 5-325 Mg Tabs (Hydrocodone-acetaminophen) .Marland Kitchen.. 1-2 tablets every 6 hours as needed for pain **rx by er** 8)  Doxycycline Hyclate 100 Mg Tabs (Doxycycline hyclate) .... One tablet by mouth two times a day for infection **rx by er** 9)  Ventolin Hfa 108 (90 Base) Mcg/act Aers (Albuterol sulfate) .... 2 puffs every 6 hours as needed for pain **rx by er** 10)  Promethazine-codeine 6.25-10 Mg/12ml Syrp (Promethazine-codeine) .... One teaspoon every 6 hours as needed for cough  Patient Instructions: 1)  Continue to take your antibiotic as ordered until you finish. 2)  Pain medications - Take as needed for cough.  3)  This should be getting better as cough improves.  4)  May take cough syrup as needed for cough. 5)  You will need to be out of work all this week.   6)  Return to work on 04/21/2009. 7)  Get x-ray over the weekend before you return to work. I will review the results on Monday 04/21/2009. 8)  follow up this week if symptoms get worse and not better Prescriptions: PROMETHAZINE-CODEINE 6.25-10 MG/5ML SYRP (PROMETHAZINE-CODEINE) One teaspoon every 6 hours as needed for cough  #4 ounces x 0   Entered and Authorized by:   Lehman Prom FNP   Signed by:   Lehman Prom FNP on 04/14/2009   Method used:   Print then Give to Patient   RxID:   [id number]    CXR  Procedure date:  04/11/2009  Findings:      right lower lobe pneumonia noted   CXR  Procedure date:  04/11/2009  Findings:      right lower lobe pneumonia noted

## 2010-05-12 NOTE — Letter (Signed)
Summary: *HSN Results Follow up  [facility]  [address] [place], [place] [address]   Phone: [phone]  Fax: [phone]      05/27/2009   Erika Nielsen [address] [place], [place]  [address]   Dear  Ms. [name],                            ____S.Drinkard,FNP   ____D. Gore,FNP       ____B. McPherson,MD   ____V. Rankins,MD    __xx__E. Mulberry,MD    ____N. Daphine Deutscher, FNP  ____D. Reche Dixon, MD    ____K. Philipp Deputy, MD    ____Other     This letter is to inform you that your recent test(s):  _______Pap Smear    _______Lab Test     _______X-ray    _______ is within acceptable limits  _______ requires a medication change  _______ requires a follow-up lab visit  _______ requires a follow-up visit with your provider   Comments:  We have been trying to reach you, if you could please give our office a call when you receive this letter.  Thank you.       _________________________________________________________ If you have any questions, please contact our office                     Sincerely,  Vesta Mixer CMA [facility]

## 2010-05-12 NOTE — Assessment & Plan Note (Signed)
Summary: CPP////KT   Vital Signs:  Patient profile:   50 year old female Menstrual status:  perimenopausal Height:      63 inches Weight:      224 pounds BMI:     39.82 Temp:     98.1 degrees F oral Pulse rate:   78 / minute Pulse rhythm:   regular Resp:     18 per minute BP sitting:   126 / 83  (left arm) Cuff size:   large  Vitals Entered By: [place] Shannon (May 01, 2009 8:55 AM) CC: cpp.... pt would like a med for constapation... Is Patient Diabetic? No Pain Assessment Patient in pain? no       Does patient need assistance? Functional Status Self care Ambulation Normal   CC:  cpp.... pt would like a med for constapation....  History of Present Illness: 50 yo female here for CPP.  Concerns:   1.  CAP at beginning of month:  doing well now.  Follow up CXR showed improvement.  2.  Tobacco Abuse:  quit end of December with pneumonia.  3.  Stroke hx and Hypertension:  pt. states she ran out of meds when her eligibility expired--months ago.  Rescheduled for 05/19/09 to get signed back up.  Has been taking Plavix and Crestor only.  Habits & Providers  Alcohol-Tobacco-Diet     Alcohol drinks/day: <1  Exercise-Depression-Behavior     Drug Use: never  Current Medications (verified): 1)  Naproxen 500 Mg Tabs (Naproxen) .... Take One Tablet By Mouth Every 12 Hours As Need For Pain 2)  Hydrochlorothiazide 25 Mg Tabs (Hydrochlorothiazide) .... Take One (1) By Mouth Daily 3)  Crestor 5 Mg  Tabs (Rosuvastatin Calcium) .... Take 1 Tab By Mouth Daily 4)  Plavix 75 Mg Tabs (Clopidogrel Bisulfate) .... Take 1 Tab By Mouth Daily 5)  Prenatal Multivit-Iron  Tabs (Prenatal Vit-Fe Sulfate-Fa) .... Take 1 Tablet By Mouth Once A Day 6)  Betamethasone Dipropionate 0.05 % Oint (Betamethasone Dipropionate) .... Apply Two Times A Day To Affected Areas Prn 7)  Hydrocodone-Acetaminophen 5-325 Mg Tabs (Hydrocodone-Acetaminophen) .Marland Kitchen.. 1-2 Tablets Every 6 Hours As Needed For Pain **rx By  Er** 8)  Doxycycline Hyclate 100 Mg Tabs (Doxycycline Hyclate) .... One Tablet By Mouth Two Times A Day For Infection **rx By Er** 9)  Ventolin Hfa 108 (90 Base) Mcg/act Aers (Albuterol Sulfate) .... 2 Puffs Every 6 Hours As Needed For Pain **rx By Er** 10)  Promethazine-Codeine 6.25-10 Mg/42ml Syrp (Promethazine-Codeine) .... One Teaspoon Every 6 Hours As Needed For Cough  Allergies (verified): No Known Drug Allergies  Past History:  Past Medical History: Reviewed history from 08/13/2008 and no changes required. h/o left knee pain eczema HTN Hyperlipidemia CVA 10/09...hypercoaguable w/u was negative.No residual side effects. Smoker  Past Surgical History: Reviewed history from 03/20/2007 and no changes required. Cholecystectomy;1983 s/p laparoscopy(exploratory);1980's  Family History: Mother, 53:   HTN Father unknown.Marland KitchenMarland KitchenPt doesn't know any info on father's family history. Sister, 44:   knee problem brothers/sister unknown(1/2 siblings):  one sister with PIH Daugher, died age 64--her baby's father shot her in head. 34.  She was 3 months pregnant at the time.   Maternal uncle died colon cancer --elderly  Social History: Reviewed history from 03/20/2007 and no changes required. Occupation:private CNA; social Single Former Smoker Alcohol use-yes Drug use-no Regular exercise-yes Drug Use:  never  Review of Systems General:  Energy is fine. Eyes:  Reading glasses. ENT:  Denies decreased hearing. CV:  Denies chest pain  or discomfort and palpitations. Resp:  Denies shortness of breath. GI:  Denies abdominal pain, dark tarry stools, and diarrhea; Constipation--occasional with blood on tissue if passes hard stool. GU:  Denies discharge, dysuria, and urinary frequency. MS:  Denies joint pain, joint redness, and joint swelling. Derm:  Denies lesion(s); eczema--controlled by cream. Neuro:  Denies numbness, tingling, and weakness. Psych:  Denies anxiety, depression, and  suicidal thoughts/plans.  Physical Exam  General:  obese, NAD Head:  Normocephalic and atraumatic without obvious abnormalities. No apparent alopecia or balding. Eyes:  No corneal or conjunctival inflammation noted. EOMI. Perrla. Funduscopic exam benign, without hemorrhages, exudates or papilledema. Vision grossly normal. Ears:  External ear exam shows no significant lesions or deformities--external canals are narrow from anterior to posterior position.  Otoscopic examination reveals clear canals, tympanic membranes are intact bilaterally without bulging, retraction, inflammation or discharge. Hearing is grossly normal bilaterally. Nose:  External nasal examination shows no deformity or inflammation. Nasal mucosa are pink and moist without lesions or exudates. Mouth:  Oral mucosa and oropharynx without lesions or exudates.  fair dentition.   Neck:  No deformities, masses, or tenderness noted. Breasts:  No mass, nodules, thickening, tenderness, bulging, retraction, inflamation, nipple discharge or skin changes noted.   Lungs:  Normal respiratory effort, chest expands symmetrically. Lungs are clear to auscultation, no crackles or wheezes. Heart:  Normal rate and regular rhythm. S1 and S2 normal without gallop, murmur, click, rub or other extra sounds. Abdomen:  Bowel sounds positive,abdomen soft and non-tender without masses, organomegaly or hernias noted. Rectal:  No external abnormalities noted. Normal sphincter tone. Pt. had difficulty complying with exam, but concern for palpated mass posterior of rectum at tip of examining digit.  Felt to be submucosal. Genitalia:  Pelvic Exam:        External: normal female genitalia without lesions or masses        Vagina: normal without lesions or masses.  Scant white discharge without odor        Cervix: normal without lesions or masses        Adnexa: normal bimanual exam without masses or fullness        Uterus: normal by palpation        Pap smear:  performed Msk:  No deformity or scoliosis noted of thoracic or lumbar spine.   Pulses:  R and L carotid,radial,femoral,dorsalis pedis and posterior tibial pulses are full and equal bilaterally Extremities:  No clubbing, cyanosis, edema, or deformity noted with normal full range of motion of all joints, other than hypertrophic changes of knees.   Neurologic:  No cranial nerve deficits noted. Station and gait are normal. Plantar reflexes are down-going bilaterally. DTRs are symmetrical throughout. Sensory, motor and coordinative functions appear intact. Skin:  Intact without suspicious lesions or rashes Cervical Nodes:  No lymphadenopathy noted Axillary Nodes:  No palpable lymphadenopathy Inguinal Nodes:  No significant adenopathy Psych:  Cognition and judgment appear intact. Alert and cooperative with normal attention span and concentration. No apparent delusions, illusions, hallucinations   Impression & Recommendations:  Problem # 1:  ROUTINE GYNECOLOGICAL EXAMINATION (ICD-V72.31)  Orders: Pap Smear, Thin Prep ( Collection of) [phone]) KOH/ WET Mount [id number]) T- GC Chlamydia (09811) T-HIV Antibody  (Reflex) [phone]) T-Syphilis Test (RPR) (13086-57846) Mammogram (Screening) (Mammo)  Problem # 2:  HEALTH MAINTENANCE EXAM (ICD-V70.0) Hemoccult x3 to return in 2 weeks Flu and Pneumovax  Problem # 3:  POSSIBLE RECTAL MASS (ICD-787.99)  Orders: Gastroenterology Referral (GI)  Problem # 4:  HYPERTENSION,  BENIGN ESSENTIAL (ICD-401.1) restart meds. Her updated medication list for this problem includes:    Hydrochlorothiazide 25 Mg Tabs (Hydrochlorothiazide) .Marland Kitchen... Take one (1) by mouth daily  Orders: UA Dipstick w/o Micro (manual) (82956)  Problem # 5:  VAGINITIS, CANDIDAL (ICD-112.1)  Her updated medication list for this problem includes:    Fluconazole 150 Mg Tabs (Fluconazole) .Marland Kitchen... 1 tab by mouth for one dose today  Complete Medication List: 1)  Hydrochlorothiazide 25 Mg  Tabs (Hydrochlorothiazide) .... Take one (1) by mouth daily 2)  Crestor 5 Mg Tabs (Rosuvastatin calcium) .... Take 1 tab by mouth daily 3)  Plavix 75 Mg Tabs (Clopidogrel bisulfate) .... Take 1 tab by mouth daily 4)  Prenatal Multivit-iron Tabs (Prenatal vit-fe sulfate-fa) .... Take 1 tablet by mouth once a day 5)  Betamethasone Dipropionate 0.05 % Oint (Betamethasone dipropionate) .... Apply two times a day to affected areas prn 6)  Aspirin 325 Mg Tabs (Aspirin) .Marland Kitchen.. 1 tab by mouth daily 7)  Fluconazole 150 Mg Tabs (Fluconazole) .Marland Kitchen.. 1 tab by mouth for one dose today  Other Orders: T-Comprehensive Metabolic Panel [phone]) T-Lipid Profile [phone]) T-CBC w/Diff ([id number]) Flu Vaccine 73yrs + [phone]) Admin 1st Vaccine (40347) Admin 1st Vaccine [facility]) [phone]) Pneumococcal Vaccine (38756) Admin of Any Addtl Vaccine (43329) Admin of Any Addtl Vaccine (State) ([phone])  Patient Instructions: 1)  Calcium 500 mg with 400 International Units of Vitamin D--1 tab by mouth two times a day  2)  Return stool cards to office in 2 weeks 3)  Follow up with Dr. Delrae Alfred in 6 months  4)  Call if you do not hear from Gastroenterologist 5)  Astroglide for lubrication  Preventive Care Screening  Prior Values:    PPD:  negative (12/30/2008)    Pap Smear:  NEGATIVE FOR INTRAEPITHELIAL LESIONS OR MALIGNANCY. (04/30/2008)    Mammogram:  ASSESSMENT: Negative - BI-RADS 1^MM DIGITAL SCREENING (05/07/2008)    Last Tetanus Booster:  Historical (10/11/2002)     Immunizations:  Has not received flu shot.  Has never had Pneumovax. SBE:  Once monthly -- no changes. LMP:  stopped having periods end of 2010--possibly August.  Maybe having night sweats. Guaiac Cards:  Never Colonoscopy:  Never Osteoprevention:  Not much dairy.  Exercising with leg lifts, sit ups.  "Body by Leta Jungling"  Plans to start walking.  Prescriptions: BETAMETHASONE DIPROPIONATE 0.05 % OINT (BETAMETHASONE DIPROPIONATE) Apply two  times a day to affected areas prn  #30 g x 1   Entered and Authorized by:   Julieanne Manson MD   Signed by:   Julieanne Manson MD on 05/01/2009   Method used:   Faxed to ...       [facility] (retail)       [address] [place], [place]  [address]       Ph: [phone]       Fax: [phone]   RxID:   [id number] PRENATAL MULTIVIT-IRON  TABS (PRENATAL VIT-FE SULFATE-FA) Take 1 tablet by mouth once a day  #30 x 11   Entered and Authorized by:   Julieanne Manson MD   Signed by:   Julieanne Manson MD on 05/01/2009   Method used:   Faxed to ...       [facility] - [facility] (retail)       [address] [place], [place]  [address]       Ph:  [phone]       Fax: [phone]   RxID:   [id number] PLAVIX 75 MG TABS (CLOPIDOGREL BISULFATE) Take 1 tab by mouth daily  #30 x 11   Entered and Authorized by:   Julieanne Manson MD   Signed by:   Julieanne Manson MD on 05/01/2009   Method used:   Faxed to ...       [facility] - [facility] (retail)       [address] [place], [place]  [address]       Ph: [phone]       Fax: [phone]   RxID:   [id number] CRESTOR 5 MG  TABS (ROSUVASTATIN CALCIUM) Take 1 tab by mouth daily  #30 x 11   Entered and Authorized by:   Julieanne Manson MD   Signed by:   Julieanne Manson MD on 05/01/2009   Method used:   Faxed to ...       [facility] (retail)       [address] [place], [place]  [address]       Ph: [phone]       Fax: [phone]   RxID:   [id number] HYDROCHLOROTHIAZIDE 25 MG TABS (HYDROCHLOROTHIAZIDE) Take one (1) by mouth daily  #30 x 11   Entered and Authorized by:   Julieanne Manson MD   Signed by:   Julieanne Manson MD on 05/01/2009   Method used:   Faxed to ...       [facility] - [facility] (retail)        [address] [place], [place]  [address]       Ph: [phone]       Fax: [phone]   RxID:   [id number] FLUCONAZOLE 150 MG TABS (FLUCONAZOLE) 1 tab by mouth for one dose today  #1 x 0   Entered and Authorized by:   Julieanne Manson MD   Signed by:   Julieanne Manson MD on 05/01/2009   Method used:   Electronically to        [facility] [phone]* (retail)       [address]       [place], [place]  [address]       Ph: [phone]       Fax: [phone]   RxID:   [id number]    Pneumovax Vaccine    Vaccine Type: Pneumovax    Site: right deltoid    Mfr: Merck    Dose: 0.5 ml    Route: IM    Given by: Vesta Mixer CMA    Exp. Date: 05/09/2010    Lot #: [id number]    VIS given: 11/08/95 version given May 01, 2009.  Influenza Vaccine    Vaccine Type: Fluvax 3+    Site: left deltoid    Mfr: Sanofi Pasteur    Dose: 0.5 ml    Route: IM    Given by: Vesta Mixer CMA    Exp. Date: 10/09/2009    Lot #: [id number]    VIS given: 11/03/06 version given May 01, 2009.  Flu Vaccine Consent Questions    Do you have a history of severe allergic reactions to this vaccine? no    Any prior history of allergic reactions to egg and/or gelatin? no    Do you have a sensitivity to the preservative Thimersol? no  Do you have a past history of Guillan-Barre Syndrome? no    Do you currently have an acute febrile illness? no    Have you ever had a severe reaction to latex? no    Vaccine information given and explained to patient? yes    Are you currently pregnant? no  Laboratory Results    Wet Mount/KOH Source: vaginal WBC/hpf: 1-5 Bacteria/hpf: 1+ Clue cells/hpf: none  Negative whiff Yeast/hpf: few Trichomonas/hpf: none  Other Tests  Rapid HIV: negative    Laboratory Results    Wet Mount  Negative whiff Wet Mount KOH: Negative  Other Tests  Rapid HIV: negative   Appended Document: hemoccults results  Laboratory Results     Stool - Occult Blood Hemmoccult #1: negative Date: 05/16/2009 Hemoccult #2: negative Date: 05/16/2009 Hemoccult #3: negative Date: 05/16/2009

## 2010-05-12 NOTE — Letter (Signed)
Summary: Work Excuse  [facility]  [address] [place], [place] [address]   Phone: [phone]  Fax: [phone]    Today's Date: April 14, 2009  Name of Patient: Erika Nielsen  The above named patient had a medical visit today   Please take this into consideration when reviewing the time away from work.   She was seen in the Emergency Room on 04/11/2009 and is in this office today for follow-up.  Special Instructions:  [  ] None  [ X] To be off the remainder of today, returning to the normal work schedule on April 21, 2009.  [  ] To be off until the next scheduled appointment on ______________________.  [  ] Other ________________________________________________________________ ________________________________________________________________________   Sincerely yours,   Lehman Prom FNP [facility]

## 2010-05-12 NOTE — Progress Notes (Signed)
Summary: referral info...  Phone Note From Other Clinic   Summary of Call: [facility] Kidney, Any word on this pt's GI referra??  Initial call taken by: Mikey College CMA,  June 04, 2009 11:44 AM  Follow-up for Phone Call        just spoke to Pt about 3pm and informed the Pt that the least expensive way to go is 50.00 up @ [facility], [facility] discount then a payment plan.Pt informed me that she didn't have the money  Follow-up by: Candi Leash,  June 04, 2009 3:07 PM  Additional Follow-up for Phone Call Additional follow up Details #1::        will forward to pt's provider for review.... Additional Follow-up by: Mikey College CMA,  June 09, 2009 12:08 PM    Additional Follow-up for Phone Call Additional follow up Details #2::    Tried to call pt, but could only leave message to call back--work number stated office closed. Want to see if there is any way pt. could gather money for a colonoscopy. If not, would like to set her up with an air contrast barium enema to evaluate for possible rectal mass.  If that does not show any concern, then will set up a pelvic CT.   Order written for BE in folder  Clld pt @[phone] lm on voicemail to call [email or web address] Cooley   June 18, 2009 1:23 PM  Follow-up by: Julieanne Manson MD,  June 14, 2009 12:13 PM  Additional Follow-up for Phone Call Additional follow up Details #3:: Details for Additional Follow-up Action Taken: Left message on answering machine for pt to return call at [phone].  Also will mail letter since 3rd attmept to reach pt........... Elmarie Shiley McCoy CMA  June 25, 2009 9:54 AM   Called again and left a message to call the office.  Will have to wait for her at this juncture  Julieanne Manson MD  July 26, 2009 4:07 PM

## 2010-05-12 NOTE — Assessment & Plan Note (Signed)
Summary: 6 MONTH  FU ///KT   Vital Signs:  Patient profile:   50 year old female Menstrual status:  perimenopausal Weight:      243 pounds Temp:     98.1 degrees F Pulse rate:   80 / minute Pulse rhythm:   regular Resp:     20 per minute BP sitting:   128 / 84  (left arm) Cuff size:   large  Vitals Entered By: Vesta Mixer CMA (October 28, 2009 12:24 PM) CC: 6 month f/u from CPP in January. Is Patient Diabetic? No  Does patient need assistance? Ambulation Normal   CC:  6 month f/u from CPP in January..  History of Present Illness: 1.  Anemia:  Pt. did not go to GI as she did not have the $50 to go.  Iron studies were not diagnostic and B12 and folate were okay.  Has not had a period for some time.  pt. states periods were heavy though prior to stopping sometime last year.  Guaiac cards were all negative.  Still taking iron twice daily.    2.  Hyperlipidemia:  pt. not sure if she is no 10 mg of Crestor or not--gets from [facility] pharmacy and that is what was sent to them in 2.11.    3.  Obesity:  Has put on almost 20 lbs since January.  Pt. states unable to get to park to walk as her car broke down.  Pt. plans to start walking in neighborhood.    4.  Eczema:  not controlled--did not realize had refill of corticosteroids.  Allergies (verified): No Known Drug Allergies  Physical Exam  General:  obese, NAD Lungs:  Normal respiratory effort, chest expands symmetrically. Lungs are clear to auscultation, no crackles or wheezes. Heart:  Normal rate and regular rhythm. S1 and S2 normal without gallop, murmur, click, rub or other extra sounds.  Radial pulses normal and equal. Extremities:  no edema   Impression & Recommendations:  Problem # 1:  POSSIBLE RECTAL MASS (ICD-787.99) Pt. did not go to GI referral Orders: CT with & without Contrast (CT w&w/o Contrast)  Problem # 2:  ANEMIA, IRON DEFICIENCY (ICD-280.9) Pt. states has been anemic all of life. Lab evaluation has  not been helpful in finding definite etiology Guaiac cards negative earlier in year Taking iron two times a day  Orders: T-CBC w/Diff ([id number])  Problem # 3:  HYPERLIPIDEMIA (ICD-272.4) non fasting today Her updated medication list for this problem includes:    Crestor 10 Mg Tabs (Rosuvastatin calcium) .Marland Kitchen... 1 tab by mouth daily  Problem # 4:  ECZEMA (ICD-692.9) Refill cream. Her updated medication list for this problem includes:    Betamethasone Dipropionate 0.05 % Oint (Betamethasone dipropionate) .Marland Kitchen... Apply two times a day to affected areas prn  Problem # 5:  OBESITY (ICD-278.00) Encouraged working on physical activity.  Complete Medication List: 1)  Hydrochlorothiazide 25 Mg Tabs (Hydrochlorothiazide) .... Take one (1) by mouth daily 2)  Crestor 10 Mg Tabs (Rosuvastatin calcium) .Marland Kitchen.. 1 tab by mouth daily 3)  Plavix 75 Mg Tabs (Clopidogrel bisulfate) .... Take 1 tab by mouth daily 4)  Prenatal Multivit-iron Tabs (Prenatal vit-fe sulfate-fa) .... Take 1 tablet by mouth once a day 5)  Betamethasone Dipropionate 0.05 % Oint (Betamethasone dipropionate) .... Apply two times a day to affected areas prn 6)  Aspirin 325 Mg Tabs (Aspirin) .Marland Kitchen.. 1 tab by mouth daily  Patient Instructions: 1)  Fasting lab for FLP and liver enzymes, CBC  in next 2 weeks. 2)  CPP with Dr. Delrae Alfred in January 2012 Prescriptions: BETAMETHASONE DIPROPIONATE 0.05 % OINT (BETAMETHASONE DIPROPIONATE) Apply two times a day to affected areas prn  #60g x 1   Entered and Authorized by:   Julieanne Manson MD   Signed by:   Julieanne Manson MD on 10/28/2009   Method used:   Faxed to ...       [facility] (retail)       [address] [place], [place]  [address]       Ph: [phone]       Fax: [phone]   RxID:   [id number]

## 2010-05-12 NOTE — Letter (Signed)
Summary: TEST ORDER FORM/CT/APPT DATE Marland Kitchen TIME  TEST ORDER FORM/CT/APPT DATE ^& TIME   Imported By: Arta Bruce 10/29/2009 09:54:12  _____________________________________________________________________  External Attachment:    Type:   Image     Comment:   External Document

## 2010-05-14 NOTE — Progress Notes (Signed)
Summary: Office Visit//DEPRESSION SCREENING  Office Visit//DEPRESSION SCREENING   Imported By: Arta Bruce 04/14/2010 14:08:42  _____________________________________________________________________  External Attachment:    Type:   Image     Comment:   External Document

## 2010-05-14 NOTE — Assessment & Plan Note (Addendum)
Summary: BP//MC  Nurse Visit   Vital Signs:  Patient profile:   50 year old female Menstrual status:  postmenopausal Weight:      235.2 pounds Temp:     98.3 degrees F oral Pulse rate:   72 / minute Pulse rhythm:   regular Resp:     24 per minute BP sitting:   130 / 74  (right arm) Cuff size:   regular  Vitals Entered By: Dutch Quint RN (April 29, 2010 10:02 AM)  CC:  BP recheck.  History of Present Illness: 04/14/10 BP 136/90  Has been taking HCTZ regularly since last visit.  Took medications this morning, no side effects.  Here for BP recheck.    Forgot to mention to provider during CPP that she has episodes of blood in stools.  Brought hemoccult cards with her.   Review of Systems CV:  Denies CP, headache, dizziness, visual changes.  Asymptomatic.Erika Nielsen GI:  Complains of bloody stools; Used to have hard, darker stools before starting to eat fiber cereal.  Now, stools are lighter and softer.  Notices blood with stools when eating pork/beef products, causes hard stools, straining.  Blood is bright red, on tissue when she wipes, states difficult to evacuate those stools.  Denies abominal pain, cramping.Erika Nielsen   Physical Exam  General:  alert, well-developed, well-nourished, well-hydrated, and overweight-appearing.     Patient Instructions: 1)  Your blood pressure is much better! 2)  Continue taking your medications as ordered. 3)  Monitor salt intake, especially "hidden" foods such as canned, sodas and processed foods.  This will also help with your weight loss. 4)  Your stool cards were negative for blood.  If only pork and beef products are causing you to have blood in your stools due to hard stools and straining, stop eating those foods and see if if stops.  If you continue to notice blood in your stools, call for appointment for further evaluation. 5)  Call to schedule an appointment with provider for June. 6)  Call if anything changes or if you have any  questions.   Impression & Recommendations:  Problem # 1:  HYPERTENSION, BENIGN ESSENTIAL (ICD-401.1) Blood pressure much better To continue meds as ordered, no change Make f/u appointment for six months  Her updated medication list for this problem includes:    Hydrochlorothiazide 25 Mg Tabs (Hydrochlorothiazide) .Erika Nielsen... Take one (1) by mouth daily  Problem # 2:  HEMORRHOIDS (ICD-455.6) Hemoccult cards negative Episodes of blood in stools mostly likely due to straining, hard stools To continue fiber in diet, change contributing foods and monitor  Complete Medication List: 1)  Hydrochlorothiazide 25 Mg Tabs (Hydrochlorothiazide) .... Take one (1) by mouth daily 2)  Crestor 10 Mg Tabs (Rosuvastatin calcium) .Erika Nielsen.. 1 tab by mouth daily 3)  Plavix 75 Mg Tabs (Clopidogrel bisulfate) .... Take 1 tab by mouth daily 4)  Betamethasone Dipropionate 0.05 % Oint (Betamethasone dipropionate) .... Apply two times a day to affected areas prn 5)  Aspirin 325 Mg Tabs (Aspirin) .Erika Nielsen.. 1 tab by mouth daily  Other Orders: Hemoccult Cards -3 specimans (take home) (16109)  CC: BP recheck Is Patient Diabetic? No Pain Assessment Patient in pain? no       Does patient need assistance? Functional Status Self care Ambulation Normal   Allergies: No Known Drug Allergies Laboratory Results    Stool - Occult Blood Hemmoccult #1: negative Date: 04/28/2010 Hemoccult #2: negative Date: 04/29/2010   Orders Added: 1)  Est. Patient Level I [  99211] 2)  Hemoccult Cards -3 specimans (take home) [82272]  Appended Document: BP//MC She did tell me she has blood in stools at times with a history of hemorrhoids.

## 2010-05-14 NOTE — Assessment & Plan Note (Signed)
Summary: CPP//mm   Vital Signs:  Patient profile:   50 year old female Menstrual status:  postmenopausal Weight:      240.44 pounds BMI:     42.75 Temp:     97.2 degrees F Pulse rate:   80 / minute Pulse rhythm:   regular Resp:     26 per minute BP sitting:   136 / 90  (left arm) Cuff size:   regular  Vitals Entered By: Hale Drone CMA (April 14, 2010 9:13 AM) CC: 50 y/o CPP. Constipation still persist. Having hot flashes. Cold x5 days. Cough, congestion, sneezing and runny nose. Denies fevers, vomiting and diarrhea.  Is Patient Diabetic? No Pain Assessment Patient in pain? no       Does patient need assistance? Functional Status Self care LMP - Character: normal     Menstrual Status postmenopausal Last PAP Result NEGATIVE FOR INTRAEPITHELIAL LESIONS OR MALIGNANCY.   CC:  50 y/o CPP. Constipation still persist. Having hot flashes. Cold x5 days. Cough, congestion, sneezing and runny nose. Denies fevers, and vomiting and diarrhea. .  History of Present Illness: 50 yo female here for CPP.  Concerns:  1.  Anemia with concern for rectal mass.  Pt. has refused to go to GI as cannot afford th $50 up front fee.  Pt. states she has had anemia all of her life.  2.  Hypertension:  has lost some weight.  Has been walking regularly.  Has worked on eating habits as well.  3.  Hyperlipidemia:  as above.  would like rechecked today.    Current Medications (verified): 1)  Hydrochlorothiazide 25 Mg Tabs (Hydrochlorothiazide) .... Take One (1) By Mouth Daily 2)  Crestor 10 Mg Tabs (Rosuvastatin Calcium) .Marland Kitchen.. 1 Tab By Mouth Daily 3)  Plavix 75 Mg Tabs (Clopidogrel Bisulfate) .... Take 1 Tab By Mouth Daily 4)  Prenatal Multivit-Iron  Tabs (Prenatal Vit-Fe Sulfate-Fa) .... Take 1 Tablet By Mouth Once A Day 5)  Betamethasone Dipropionate 0.05 % Oint (Betamethasone Dipropionate) .... Apply Two Times A Day To Affected Areas Prn 6)  Aspirin 325 Mg Tabs (Aspirin) .Marland Kitchen.. 1 Tab By Mouth  Daily  Allergies (verified): No Known Drug Allergies  Past History:  Past Surgical History: 1.  1983:  Cholecystectomy 2.  1990s:  s/p laparoscopy(exploratory)--cannot recall why  Family History: Mother, 43:   HTN Father unknown.Marland KitchenMarland KitchenPt doesn't know any info on father's family history. Sister, 63:   knee problem brothers/sister unknown(1/2 siblings):  one sister with PIH Daughter, died age 63--her baby's father shot her in head. 60.  She was 3 months pregnant at the time.   Maternal uncle died colon cancer --elderly  Social History: Occupation:private CNA; social Single Former Smoker:  started 47.  Stopped 2009.  Smoked 1ppd Alcohol use:  2 drinks on weekend--not every weekend Drug use-no Regular exercise-yes  Review of Systems General:  Energy is good. Eyes:  Reading glasses. ENT:  Denies decreased hearing. CV:  Denies chest pain or discomfort and palpitations; Lightheadedness if bends over too far. Resp:  Denies shortness of breath. GI:  Denies abdominal pain and dark tarry stools; Has hemorrhoids, internal.  Bleed with hard stools.  Does have intermittent constipation.  Eating fiber plus bars that keep this under control. GU:  Denies discharge, dysuria, and urinary frequency; Sexually active--one partner.. MS:  Denies joint pain, joint redness, and joint swelling. Derm:  Denies lesion(s) and rash; Eczema controlled. Neuro:  Denies numbness, tingling, and weakness. Psych:  Denies anxiety, depression,  and suicidal thoughts/plans.  Physical Exam  General:  Morbidly obese, NAD Head:  Normocephalic and atraumatic without obvious abnormalities. No apparent alopecia or balding. Eyes:  No corneal or conjunctival inflammation noted. EOMI. Perrla. Funduscopic exam benign, without hemorrhages, exudates or papilledema. Vision grossly normal. Ears:  External ear exam shows no significant lesions or deformities.  Otoscopic examination reveals clear canals, tympanic membranes are  intact bilaterally without bulging, retraction, inflammation or discharge. Hearing is grossly normal bilaterally. Nose:  External nasal examination shows no deformity or inflammation. Nasal mucosa are pink and moist without lesions or exudates. Mouth:  Oral mucosa and oropharynx without lesions or exudates.  fair dentition.   Neck:  No deformities, masses, or tenderness noted. Breasts:  No mass, nodules, thickening, tenderness, bulging, retraction, inflamation, nipple discharge or skin changes noted.   Lungs:  Normal respiratory effort, chest expands symmetrically. Lungs are clear to auscultation, no crackles or wheezes. Heart:  Normal rate and regular rhythm. S1 and S2 normal without gallop, murmur, click, rub or other extra sounds. Abdomen:  Bowel sounds positive,abdomen soft and non-tender without masses, organomegaly or hernias noted. Rectal:  No external abnormalities noted. Normal sphincter tone. No rectal masses or tenderness noted today.  Heme negative light brown stool. Anoscopy:  no mass on exam today.  No significant hemorrhoids noted as well. Genitalia:  Pelvic Exam: LImited by pt's size        External: normal female genitalia without lesions or masses        Vagina: normal without lesions or masses        Cervix: normal without lesions or masses        Adnexa: normal bimanual exam without masses or fullness        Uterus: normal by palpation, but retroverted.        Pap smear: performed Msk:  No deformity or scoliosis noted of thoracic or lumbar spine.   Pulses:  R and L carotid,radial,femoral,dorsalis pedis and posterior tibial pulses are full and equal bilaterally Extremities:  No clubbing, cyanosis, edema, or deformity noted with normal full range of motion of all joints.   Neurologic:  No cranial nerve deficits noted. Station and gait are normal. Plantar reflexes are down-going bilaterally. DTRs are symmetrical throughout. Sensory, motor and coordinative functions appear  intact. Skin:  Intact without suspicious lesions or rashes.  Dry at low back Cervical Nodes:  No lymphadenopathy noted Axillary Nodes:  No palpable lymphadenopathy Inguinal Nodes:  No significant adenopathy Psych:  Cognition and judgment appear intact. Alert and cooperative with normal attention span and concentration. No apparent delusions, illusions, hallucinations   Impression & Recommendations:  Problem # 1:  ROUTINE GYNECOLOGICAL EXAMINATION (ICD-V72.31) Osteoprevention adequate Orders: Mammogram (Screening) (Mammo) Pap Smear, Thin Prep ( Collection of) (Q0091) KOH/ WET Mount [phone]) UA Dipstick w/o Micro (manual) (60454) T-Pap Smear, Thin Prep (09811) T-Syphilis Test (RPR) (91478-29562) T-HIV Antibody  (Reflex) [id number]) T- GC Chlamydia (96295)  Problem # 2:  HEALTH MAINTENANCE EXAM (ICD-V70.0) Flu vaccine today Hemoccult cards x3 to return in 2 weeks.  Problem # 3:  POSSIBLE RECTAL MASS (ICD-787.99)  No findings today--may have been an internal hemorrhoid Anoscopy did not show a mass as well  Orders: Anoscopy (28413)  Problem # 4:  HYPERLIPIDEMIA (ICD-272.4)  Her updated medication list for this problem includes:    Crestor 10 Mg Tabs (Rosuvastatin calcium) .Marland Kitchen... 1 tab by mouth daily  Orders: T-Lipid Profile (24401-02725)  Problem # 5:  HYPERTENSION, BENIGN ESSENTIAL (ICD-401.1) To return for  bp check Her updated medication list for this problem includes:    Hydrochlorothiazide 25 Mg Tabs (Hydrochlorothiazide) .Marland Kitchen... Take one (1) by mouth daily  Orders: UA Dipstick w/o Micro (manual) (09811) T-Basic Metabolic Panel (91478-29562)  Complete Medication List: 1)  Hydrochlorothiazide 25 Mg Tabs (Hydrochlorothiazide) .... Take one (1) by mouth daily 2)  Crestor 10 Mg Tabs (Rosuvastatin calcium) .Marland Kitchen.. 1 tab by mouth daily 3)  Plavix 75 Mg Tabs (Clopidogrel bisulfate) .... Take 1 tab by mouth daily 4)  Betamethasone Dipropionate 0.05 % Oint (Betamethasone  dipropionate) .... Apply two times a day to affected areas prn 5)  Aspirin 325 Mg Tabs (Aspirin) .Marland Kitchen.. 1 tab by mouth daily  Other Orders: Flu Vaccine 2yrs + (13086) Admin 1st Vaccine (57846) T-CBC w/Diff [phone]) T- * Misc. Laboratory test [phone])  Patient Instructions: 1)  Try Black Cohosh for hot flashes 2)  nurse visit for bp check in 2 weeks. 3)  Follow up with Dr. Delrae Alfred in 6 months --htn, weight.   Orders Added: 1)  Flu Vaccine 52yrs + [90658] 2)  Admin 1st Vaccine [90471] 3)  Mammogram (Screening) [Mammo] 4)  Est. Patient age 66-64 [phone] 5)  Pap Smear, Thin Prep ( Collection of) [Q0091] 6)  KOH/ WET Mount [87210] 7)  UA Dipstick w/o Micro (manual) [81002] 8)  T-Lipid Profile [80061-22930] 9)  T-CBC w/Diff [36644-03474] 10)  T- * Misc. Laboratory test [99999] 11)  T-Pap Smear, Thin Prep [88142] 12)  T-Syphilis Test (RPR) [25956-38756] 13)  T-HIV Antibody  (Reflex) [[id number]] 14)  T- GC Chlamydia [43329] 15)  T-Basic Metabolic Panel [80048-22910] 16)  Anoscopy [46600]   Immunizations Administered:  Influenza Vaccine # 1:    Vaccine Type: Fluvax 3+    Site: left deltoid    Mfr: GlaxoSmithKline    Dose: 0.5 ml    Route: IM    Given by: Hale Drone CMA    Exp. Date: 10/10/2010    Lot #: [id number]    VIS given: 11/04/09 version given April 14, 2010.  Flu Vaccine Consent Questions:    Do you have a history of severe allergic reactions to this vaccine? no    Any prior history of allergic reactions to egg and/or gelatin? no    Do you have a sensitivity to the preservative Thimersol? no    Do you have a past history of Guillan-Barre Syndrome? no    Do you currently have an acute febrile illness? no    Have you ever had a severe reaction to latex? no    Vaccine information given and explained to patient? yes    Are you currently pregnant? no   Preventive Care Screening  Prior Values:    PPD:  negative (12/30/2008)    Pap Smear:  NEGATIVE FOR  INTRAEPITHELIAL LESIONS OR MALIGNANCY. (05/01/2009)    Mammogram:  ASSESSMENT: Negative - BI-RADS 1^MM DIGITAL SCREENING (05/21/2009)    Last Tetanus Booster:  Historical (10/11/2002)     Immunizations:  due for flu vaccine today SBE:  no changes LMP:  stopped periods about 2 years ago. Osteoprevention:  2% milk--one serving daily.  Does like yogurt.  Does take calcium with Vitamin D two times a day.  Walking regularly. Guaiac Cards:  no  Colonoscopy:  has refused--does not have the $50 per pt.   Immunizations Administered:  Influenza Vaccine # 1:    Vaccine Type: Fluvax 3+    Site: left deltoid    Mfr: GlaxoSmithKline    Dose: 0.5 ml  Route: IM    Given by: Hale Drone CMA    Exp. Date: 10/10/2010    Lot #: [id number]    VIS given: 11/04/09 version given April 14, 2010.   Laboratory Results    Wet Mount/KOH Source: vaginal WBC/hpf: 1-5 Bacteria/hpf: 1+ Clue cells/hpf: none  Negative whiff Yeast/hpf: none Trichomonas/hpf: none    Appended Document: UA    Lab Visit  Laboratory Results   Urine Tests  Date/Time Received: April 14, 2010 10:27 AM   Routine Urinalysis   Color: lt. yellow Appearance: Clear Glucose: negative   (Normal Range: Negative) Bilirubin: negative   (Normal Range: Negative) Ketone: negative   (Normal Range: Negative) Spec. Gravity: >=1.030   (Normal Range: 1.003-1.035) Blood: negative   (Normal Range: Negative) pH: 5.5   (Normal Range: 5.0-8.0) Protein: trace   (Normal Range: Negative) Urobilinogen: 0.2   (Normal Range: 0-1) Nitrite: negative   (Normal Range: Negative) Leukocyte Esterace: negative   (Normal Range: Negative)      Orders Today:

## 2010-05-14 NOTE — Progress Notes (Signed)
Summary: Labs and pap  Phone Note Outgoing Call   Summary of Call: Have pt. come in for repeat fasting glucose with an A1C as well as her glucose at CPP was a bit high.   Let her know she continues to have anemia, though her stool cards were negative for  blood.   She did share with me at her visit that she has had blood in her stools when passing hard stools and has history of hemorrhoids. Her hemoglobin testing was okay--she does not appear to have an abnormal hemoglobin, like sickle cell or thallasemia. I still recommend she get a colonoscopy--she should try and save up the $50 to do so. Her pap and rest of labs were okay, though her good cholesterol was a bit low and triglycerides were a bit high--needs to work on eating healthier and being more physically active. Initial call taken by: Julieanne Manson MD,  May 04, 2010 2:40 PM  Follow-up for Phone Call        Left message on answering machine for pt to call back...Marland KitchenMarland KitchenArmenia Shannon  May 04, 2010 3:47 PM   PT IS AWARE OF LAB RESULTS AND WILL CALL BACK FOR APPT Follow-up by: [place] Shannon,  May 06, 2010 11:19 AM

## 2010-05-22 ENCOUNTER — Ambulatory Visit (HOSPITAL_COMMUNITY): Admission: RE | Admit: 2010-05-22 | Payer: Self-pay | Source: Home / Self Care | Admitting: Internal Medicine

## 2010-05-22 ENCOUNTER — Ambulatory Visit (HOSPITAL_COMMUNITY)
Admission: RE | Admit: 2010-05-22 | Discharge: 2010-05-22 | Disposition: A | Discharge status: Home / Self Care | Payer: Self-pay | Source: Ambulatory Visit | Attending: Internal Medicine | Admitting: Internal Medicine

## 2010-05-22 DIAGNOSIS — Z1231 Encounter for screening mammogram for malignant neoplasm of breast: Secondary | ICD-10-CM | POA: Insufficient documentation

## 2010-08-25 NOTE — Group Therapy Note (Signed)
Erika Nielsen, Erika Nielsen NO.:  [id number]   MEDICAL RECORD NO.:  [id number]          PATIENT TYPE:  WOC   LOCATION:  [facility]                   FACILITY:  [facility]   PHYSICIAN:  Argentina Donovan, MD        DATE OF BIRTH:  11/06/1960   DATE OF SERVICE:  07/04/2008                                  CLINIC NOTE   The patient is a 50 year old patient who was sent in by [facility] to  discuss tubal ligation.  She has had a mini stroke.  She had been on  birth control pills.  She is a smoker, and she has hypertension.  She  has first-degree prolapse of the uterus, and after consultation with  her, I have told her she does not need contraception, although she uses  condoms presently, and I told her continue doing that to prevent  sexually transmitted disease but not having periods and having hot  flashes at her age 26 to put herself through an operation would be  foolish.  We discussed a bit before she told me she had not had a period  in 2 years the possibility of an IUD, but after I heard of that, I think  that we just decided that this lady does not need anything with any kind  of risk as contraception.  She is not going to get pregnant.   IMPRESSION:  Perimenopausal patient with first-degree uterine prolapse.  Consultation only.           ______________________________  Argentina Donovan, MD     PR/MEDQ  D:  07/04/2008  T:  07/04/2008  Job:  [id number]

## 2011-03-25 ENCOUNTER — Other Ambulatory Visit: Payer: Self-pay | Admitting: Internal Medicine

## 2011-03-25 DIAGNOSIS — Z1231 Encounter for screening mammogram for malignant neoplasm of breast: Secondary | ICD-10-CM

## 2011-04-29 ENCOUNTER — Other Ambulatory Visit: Payer: Self-pay | Admitting: Family Medicine

## 2011-04-29 ENCOUNTER — Ambulatory Visit (HOSPITAL_COMMUNITY)
Admission: RE | Admit: 2011-04-29 | Discharge: 2011-04-29 | Disposition: A | Discharge status: Home / Self Care | Payer: Self-pay | Source: Ambulatory Visit | Attending: Internal Medicine | Admitting: Internal Medicine

## 2011-04-29 DIAGNOSIS — Z1231 Encounter for screening mammogram for malignant neoplasm of breast: Secondary | ICD-10-CM | POA: Insufficient documentation

## 2012-09-14 ENCOUNTER — Encounter (HOSPITAL_COMMUNITY): Payer: Self-pay | Admitting: *Deleted

## 2012-09-14 ENCOUNTER — Emergency Department (HOSPITAL_COMMUNITY)
Admission: EM | Admit: 2012-09-14 | Discharge: 2012-09-14 | Disposition: A | Discharge status: Home / Self Care | Payer: BC Managed Care – PPO | Source: Home / Self Care | Attending: Emergency Medicine | Admitting: Emergency Medicine

## 2012-09-14 ENCOUNTER — Emergency Department (INDEPENDENT_AMBULATORY_CARE_PROVIDER_SITE_OTHER): Payer: BC Managed Care – PPO

## 2012-09-14 DIAGNOSIS — I10 Essential (primary) hypertension: Secondary | ICD-10-CM

## 2012-09-14 DIAGNOSIS — J45901 Unspecified asthma with (acute) exacerbation: Secondary | ICD-10-CM

## 2012-09-14 DIAGNOSIS — J4521 Mild intermittent asthma with (acute) exacerbation: Secondary | ICD-10-CM

## 2012-09-14 HISTORY — DX: Essential (primary) hypertension: I10

## 2012-09-14 HISTORY — DX: Calculus of gallbladder without cholecystitis without obstruction: K80.20

## 2012-09-14 MED ORDER — BENZONATATE 200 MG PO CAPS: 200.0000 mg | ORAL_CAPSULE | Freq: Three times a day (TID) | ORAL | Status: DC | PRN

## 2012-09-14 MED ORDER — PREDNISONE 20 MG PO TABS: 20.0000 mg | ORAL_TABLET | Freq: Two times a day (BID) | ORAL | Status: DC

## 2012-09-14 MED ORDER — ALBUTEROL SULFATE HFA 108 (90 BASE) MCG/ACT IN AERS: 2.0000 | INHALATION_SPRAY | Freq: Four times a day (QID) | RESPIRATORY_TRACT | Status: DC

## 2012-09-14 MED ORDER — IBUPROFEN 800 MG PO TABS
ORAL_TABLET | ORAL | Status: AC
  Filled 2012-09-14: qty 1

## 2012-09-14 MED ORDER — IBUPROFEN 800 MG PO TABS
800.0000 mg | ORAL_TABLET | Freq: Once | ORAL | Status: AC
  Administered 2012-09-14: 800 mg via ORAL

## 2012-09-14 MED ORDER — AMOXICILLIN 500 MG PO CAPS: 1000.0000 mg | ORAL_CAPSULE | Freq: Three times a day (TID) | ORAL | Status: DC

## 2012-09-14 MED ORDER — HYDROCHLOROTHIAZIDE 25 MG PO TABS: 25.0000 mg | ORAL_TABLET | Freq: Every day | ORAL | Status: DC

## 2012-09-14 NOTE — ED Provider Notes (Signed)
Chief Complaint:   Chief Complaint  Patient presents with  . Cough  . Nasal Congestion    History of Present Illness:   Erika Nielsen is a 52 year old female who presents with a one-week history of a cough productive yellow-green sputum, chest tightness, chest pain, nasal congestion with yellow-green drainage, sinus pressure, ear congestion, has felt feverish, had sweats, and chills, sore throat, hoarseness, and nausea. She rates her pain as an 8/10 in intensity. She has had no sick exposures. She denies any history of asthma. She is a former cigarette smoker.  Review of Systems:  Other than noted above, the patient denies any of the following symptoms: Systemic:  No fevers, chills, sweats, weight loss or gain, fatigue, or tiredness. Eye:  No redness or discharge. ENT:  No ear pain, drainage, headache, nasal congestion, drainage, sinus pressure, difficulty swallowing, or sore throat. Neck:  No neck pain or swollen glands. Lungs:  No cough, sputum production, hemoptysis, wheezing, chest tightness, shortness of breath or chest pain. GI:  No abdominal pain, nausea, vomiting or diarrhea.  PMFSH:  Past medical history, family history, social history, meds, and allergies were reviewed. She has high blood pressure, but is not taking any blood pressure medications right now.  Physical Exam:   Vital signs:  BP 172/99  Pulse 95  Temp(Src) 99.1 F (37.3 C) (Oral)  Resp 22  SpO2 96% General:  Alert and oriented.  In no distress.  Skin warm and dry. Eye:  No conjunctival injection or drainage. Lids were normal. ENT:  TMs and canals were normal, without erythema or inflammation.  Nasal mucosa was clear and uncongested, without drainage.  Mucous membranes were moist.  Pharynx was clear with no exudate or drainage.  There were no oral ulcerations or lesions. Neck:  Supple, no adenopathy, tenderness or mass. Lungs:  No respiratory distress.  She has inspiratory and expiratory wheezes in all lung fields  both anteriorly and posteriorly, no rales or rhonchi.  Heart:  Regular rhythm, without gallops, murmers or rubs. Skin:  Clear, warm, and dry, without rash or lesions.  Radiology:  Dg Chest 2 View  09/14/2012   *RADIOLOGY REPORT*  Clinical Data: Cough and wheezing, shortness of breath  CHEST - 2 VIEW  Comparison: 04/19/2009  Findings: Lungs are mildly improved aeration with persistent minimal crowding of the lung bases.  Heart size is normal.  No pleural effusion.  No acute osseous finding.  IMPRESSION: Improved aeration with persistent crowding of the bronchovascular markings but no new focal abnormality.   Original Report Authenticated By: Christiana Pellant, M.D.    Course in Urgent Care Center:   She was given ibuprofen 800 mg by mouth for pain.  Assessment:  The primary encounter diagnosis was Asthmatic bronchitis, mild intermittent, with acute exacerbation. A diagnosis of Hypertension was also pertinent to this visit.  Will treat her bronchitis with amoxicillin, albuterol, Tessalon, and prednisone. She needs something for her blood pressure and was restarted on her HCTZ, with advice to followup with her primary care physician early next week.  Plan:   1.  The following meds were prescribed:   Discharge Medication List as of 09/14/2012  6:28 PM    START taking these medications   Details  albuterol (PROVENTIL HFA;VENTOLIN HFA) 108 (90 BASE) MCG/ACT inhaler Inhale 2 puffs into the lungs 4 (four) times daily., Starting 09/14/2012, Until Discontinued, Normal    amoxicillin (AMOXIL) 500 MG capsule Take 2 capsules (1,000 mg total) by mouth 3 (three) times daily.,  Starting 09/14/2012, Until Discontinued, Normal    benzonatate (TESSALON) 200 MG capsule Take 1 capsule (200 mg total) by mouth 3 (three) times daily as needed for cough., Starting 09/14/2012, Until Discontinued, Normal    hydrochlorothiazide (HYDRODIURIL) 25 MG tablet Take 1 tablet (25 mg total) by mouth daily., Starting 09/14/2012, Until  Discontinued, Normal    predniSONE (DELTASONE) 20 MG tablet Take 1 tablet (20 mg total) by mouth 2 (two) times daily., Starting 09/14/2012, Until Discontinued, Normal       2.  The patient was instructed in symptomatic care and handouts were given. 3.  The patient was told to return if becoming worse in any way, if no better in 3 or 4 days, and given some red flag symptoms such as fever or difficulty breathing that would indicate earlier return. 4.  Follow up with her primary care physician next week.      Reuben Likes, MD 09/14/12 2202

## 2012-09-14 NOTE — ED Notes (Signed)
C/O productive cough, nasal congestion, feeling feverish x 1 wk.  Has been taking Zyrtec, Tussin, and Alka Seltzer Cold.  Reports discomfort in distal throat/upper chest.

## 2013-01-16 ENCOUNTER — Other Ambulatory Visit (HOSPITAL_COMMUNITY): Payer: Self-pay | Admitting: Internal Medicine

## 2013-01-16 DIAGNOSIS — Z1231 Encounter for screening mammogram for malignant neoplasm of breast: Secondary | ICD-10-CM

## 2013-01-23 ENCOUNTER — Ambulatory Visit (HOSPITAL_COMMUNITY): Payer: BC Managed Care – PPO

## 2013-01-29 ENCOUNTER — Ambulatory Visit (HOSPITAL_COMMUNITY)
Admission: RE | Admit: 2013-01-29 | Discharge: 2013-01-29 | Disposition: A | Discharge status: Home / Self Care | Payer: BC Managed Care – PPO | Source: Ambulatory Visit | Attending: Internal Medicine | Admitting: Internal Medicine

## 2013-01-29 DIAGNOSIS — Z1231 Encounter for screening mammogram for malignant neoplasm of breast: Secondary | ICD-10-CM | POA: Insufficient documentation

## 2014-02-01 ENCOUNTER — Other Ambulatory Visit (HOSPITAL_COMMUNITY): Payer: Self-pay | Admitting: Internal Medicine

## 2014-02-01 DIAGNOSIS — Z1231 Encounter for screening mammogram for malignant neoplasm of breast: Secondary | ICD-10-CM

## 2014-02-07 ENCOUNTER — Ambulatory Visit (HOSPITAL_COMMUNITY)
Admission: RE | Admit: 2014-02-07 | Discharge: 2014-02-07 | Disposition: A | Discharge status: Home / Self Care | Payer: BC Managed Care – PPO | Source: Ambulatory Visit | Attending: Internal Medicine | Admitting: Internal Medicine

## 2014-02-07 ENCOUNTER — Ambulatory Visit (HOSPITAL_COMMUNITY): Payer: BC Managed Care – PPO

## 2014-02-07 DIAGNOSIS — Z1231 Encounter for screening mammogram for malignant neoplasm of breast: Secondary | ICD-10-CM

## 2015-03-21 ENCOUNTER — Encounter: Payer: Self-pay | Admitting: General Practice

## 2015-03-21 DIAGNOSIS — Z139 Encounter for screening, unspecified: Secondary | ICD-10-CM

## 2015-03-21 LAB — GLUCOSE, POCT (MANUAL RESULT ENTRY): POC GLUCOSE: 282 mg/dL — AB (ref 70–99)

## 2015-03-21 NOTE — Congregational Nurse Program (Signed)
Congregational Nurse Program Note  Date of Encounter: 03/21/2015  Past Medical History: Past Medical History  Diagnosis Date   Hypertension    Gall stones     Encounter Details:     CNP Questionnaire - 03/21/15 1419    Patient Demographics   Is this a new or existing patient? New   Patient is considered a/an Not Applicable   Patient Assistance   Patient's financial/insurance status Uninsured   Patient referred to apply for the following financial assistance [place] insecurities addressed Not Applicable   Transportation assistance No   Assistance securing medications No   Educational health offerings Nutrition;Diabetes;Hypertension   Encounter Details   Primary purpose of visit Other (comment)  Health Fair at [facility]   Was an Emergency Department visit averted? Yes   Does patient have a medical provider? No   Patient referred to Other (comment)  [facility]   Was a mental health screening completed? (GAINS tool) No   Does patient have dental issues? No   Since previous encounter, have you referred patient for abnormal blood pressure that resulted in a new diagnosis or medication change? No   Since previous encounter, have you referred patient for abnormal blood glucose that resulted in a new diagnosis or medication change? No   For Abstraction Use Only   Does patient have insurance? No      BP 174/102 mmHg   Pulse 70  Patient visited health fair and screening at [facility].  Consent received by patient for screenings and flu vaccine.  B/P and Blood Glucose taken, Flu Injection Administered.  Patient counseled on hypertension to lower B/P, increase exercise, lose weight and modify diet, advised to have B/P repeated in 2 weeks.  Patient counseled on elevated blood glucose level, called [facility], patient unable to go to the doctor today and denied appt which was available for Monday, 03/24/15 due to work commitments, did accept appt. for  Wednesday, 03/26/15 at 3:25pm.  ER visit averted. Flu injection administered in left deltoid, vaccine information sheet given to patient.  Prior to today, patient did not have health insurance coverage, received orange card today.

## 2015-04-03 ENCOUNTER — Encounter (HOSPITAL_COMMUNITY): Payer: Self-pay | Admitting: *Deleted

## 2015-04-03 ENCOUNTER — Emergency Department (HOSPITAL_COMMUNITY): Payer: Self-pay

## 2015-04-03 ENCOUNTER — Emergency Department (HOSPITAL_COMMUNITY)
Admission: EM | Admit: 2015-04-03 | Discharge: 2015-04-03 | Disposition: A | Discharge status: Home / Self Care | Payer: Self-pay | Attending: Emergency Medicine | Admitting: Emergency Medicine

## 2015-04-03 DIAGNOSIS — Z87891 Personal history of nicotine dependence: Secondary | ICD-10-CM | POA: Insufficient documentation

## 2015-04-03 DIAGNOSIS — R52 Pain, unspecified: Secondary | ICD-10-CM | POA: Insufficient documentation

## 2015-04-03 DIAGNOSIS — E119 Type 2 diabetes mellitus without complications: Secondary | ICD-10-CM | POA: Insufficient documentation

## 2015-04-03 DIAGNOSIS — I10 Essential (primary) hypertension: Secondary | ICD-10-CM | POA: Insufficient documentation

## 2015-04-03 DIAGNOSIS — Z79899 Other long term (current) drug therapy: Secondary | ICD-10-CM | POA: Insufficient documentation

## 2015-04-03 HISTORY — DX: Type 2 diabetes mellitus without complications: E11.9

## 2015-04-03 LAB — CBC WITH DIFFERENTIAL/PLATELET
BASOS ABS: 0 10*3/uL (ref 0.0–0.1)
BASOS PCT: 0 %
EOS PCT: 1 %
Eosinophils Absolute: 0.1 10*3/uL (ref 0.0–0.7)
HCT: 36.1 % (ref 36.0–46.0)
Hemoglobin: 11.6 g/dL — ABNORMAL LOW (ref 12.0–15.0)
LYMPHS PCT: 52 %
Lymphs Abs: 3.5 10*3/uL (ref 0.7–4.0)
MCH: 27.1 pg (ref 26.0–34.0)
MCHC: 32.1 g/dL (ref 30.0–36.0)
MCV: 84.3 fL (ref 78.0–100.0)
MONO ABS: 0.4 10*3/uL (ref 0.1–1.0)
Monocytes Relative: 5 %
NEUTROS ABS: 2.8 10*3/uL (ref 1.7–7.7)
Neutrophils Relative %: 42 %
PLATELETS: 305 10*3/uL (ref 150–400)
RBC: 4.28 MIL/uL (ref 3.87–5.11)
RDW: 14 % (ref 11.5–15.5)
WBC: 6.7 10*3/uL (ref 4.0–10.5)

## 2015-04-03 LAB — HEPATIC FUNCTION PANEL
ALBUMIN: 4.2 g/dL (ref 3.5–5.0)
ALT: 30 U/L (ref 14–54)
AST: 26 U/L (ref 15–41)
Alkaline Phosphatase: 77 U/L (ref 38–126)
Bilirubin, Direct: <0.1 mg/dL — ABNORMAL LOW (ref 0.1–0.5)
TOTAL PROTEIN: 8 g/dL (ref 6.5–8.1)
Total Bilirubin: 0.4 mg/dL (ref 0.3–1.2)

## 2015-04-03 LAB — CK: CK TOTAL: 237 U/L — AB (ref 38–234)

## 2015-04-03 LAB — BASIC METABOLIC PANEL
ANION GAP: 11 (ref 5–15)
BUN: 18 mg/dL (ref 6–20)
CALCIUM: 10.4 mg/dL — AB (ref 8.9–10.3)
CO2: 26 mmol/L (ref 22–32)
Chloride: 98 mmol/L — ABNORMAL LOW (ref 101–111)
Creatinine, Ser: 1.13 mg/dL — ABNORMAL HIGH (ref 0.44–1.00)
GFR, EST NON AFRICAN AMERICAN: 55 mL/min — AB (ref >60)
Glucose, Bld: 241 mg/dL — ABNORMAL HIGH (ref 65–99)
POTASSIUM: 4.3 mmol/L (ref 3.5–5.1)
Sodium: 135 mmol/L (ref 135–145)

## 2015-04-03 LAB — LIPASE, BLOOD: LIPASE: 154 U/L — AB (ref 11–51)

## 2015-04-03 MED ORDER — IBUPROFEN 800 MG PO TABS
800.0000 mg | ORAL_TABLET | Freq: Once | ORAL | Status: AC
  Administered 2015-04-03: 800 mg via ORAL
  Filled 2015-04-03: qty 1

## 2015-04-03 MED ORDER — OXYCODONE-ACETAMINOPHEN 5-325 MG PO TABS: 2.0000 | ORAL_TABLET | ORAL | Status: DC | PRN

## 2015-04-03 MED ORDER — ACETAMINOPHEN 500 MG PO TABS: 1000.0000 mg | ORAL_TABLET | Freq: Once | ORAL | Status: DC

## 2015-04-03 MED ORDER — IBUPROFEN 800 MG PO TABS: 800.0000 mg | ORAL_TABLET | Freq: Three times a day (TID) | ORAL | Status: DC

## 2015-04-03 MED ORDER — METHOCARBAMOL 500 MG PO TABS
1000.0000 mg | ORAL_TABLET | Freq: Once | ORAL | Status: AC
  Administered 2015-04-03: 1000 mg via ORAL
  Filled 2015-04-03: qty 2

## 2015-04-03 MED ORDER — METHOCARBAMOL 500 MG PO TABS: 500.0000 mg | ORAL_TABLET | Freq: Two times a day (BID) | ORAL | Status: DC

## 2015-04-03 MED ORDER — SODIUM CHLORIDE 0.9 % IV BOLUS (SEPSIS)
1000.0000 mL | Freq: Once | INTRAVENOUS | Status: AC
  Administered 2015-04-03: 1000 mL via INTRAVENOUS

## 2015-04-03 MED ORDER — MORPHINE SULFATE (PF) 4 MG/ML IV SOLN
4.0000 mg | Freq: Once | INTRAVENOUS | Status: AC
  Administered 2015-04-03: 4 mg via INTRAVENOUS
  Filled 2015-04-03: qty 1

## 2015-04-03 MED ORDER — ONDANSETRON HCL 4 MG/2ML IJ SOLN
4.0000 mg | Freq: Once | INTRAMUSCULAR | Status: AC
  Administered 2015-04-03: 4 mg via INTRAVENOUS
  Filled 2015-04-03: qty 2

## 2015-04-03 NOTE — ED Notes (Signed)
The pt is c/o total body cramps  Since Monday when her doctor started her on a new med.  She called him and was told to reduce the dose.  That did not help   She has been cramping since.  She last took it Tuesday the cramps continue

## 2015-04-03 NOTE — ED Provider Notes (Signed)
CSN: [id number]     Arrival date & time 04/03/15  1800 History   First MD Initiated Contact with Patient 04/03/15 1826     Chief Complaint  Patient presents with  . Generalized Body Aches     (Consider location/radiation/quality/duration/timing/severity/associated sxs/prior Treatment) HPI   Erika Nielsen is a 54 y.o. female, with a history of hypertension and DM, presenting to the ED with generalized body aches for the past four days. Pt states she was put on metformin the day these aches began for a new diagnosis of DM. Pt notified her PCP about the aches and was told to reduce the dose from 1000 mg per day to 500 mg per day starting 12/20. Aches are all throughout her body and rates it 10/10. Pt adds that she also began taking a new blood pressure medication on the same day: Triamterene/HCTZ 37.5/25mg . Pt was previously on HCTZ by itself. Pt denies any other medication changes. Pt denies shortness of breath, chest pain, abdominal pain, N/V/C/D, fever/chills, or any other pain or complaints.  Past Medical History  Diagnosis Date  . Hypertension   . Gall stones   . Diabetes mellitus without complication [facility])    Past Surgical History  Procedure Laterality Date  . Gallstone removal     No family history on file. Social History  Substance Use Topics  . Smoking status: Former Research scientist (life sciences)  . Smokeless tobacco: None  . Alcohol Use: Yes     Comment: occasional   OB History    No data available     Review of Systems  Constitutional: Negative for fever, chills and diaphoresis.  Respiratory: Negative for shortness of breath.   Cardiovascular: Negative for chest pain.  Gastrointestinal: Negative for abdominal pain.  Genitourinary: Negative for flank pain and difficulty urinating.  Musculoskeletal: Positive for myalgias (Overall body aches). Negative for back pain and neck pain.  Skin: Negative for color change and pallor.  Neurological: Negative for dizziness, syncope, weakness,  light-headedness and headaches.  All other systems reviewed and are negative.     Allergies  Review of patient's allergies indicates no known allergies.  Home Medications   Prior to Admission medications   Medication Sig Start Date End Date Taking? Authorizing Provider  albuterol (PROVENTIL HFA;VENTOLIN HFA) 108 (90 BASE) MCG/ACT inhaler Inhale 2 puffs into the lungs 4 (four) times daily. 09/14/12  Yes Harden Mo, MD  aspirin 325 MG tablet Take 325 mg by mouth daily.   Yes Historical Provider, MD  Calcium Carbonate-Vitamin D (CALCIUM + D PO) Take 1 tablet by mouth 2 (two) times daily.    Yes Historical Provider, MD  metFORMIN (GLUCOPHAGE) 500 MG tablet Take 500 mg by mouth daily with breakfast.   Yes Historical Provider, MD  Methylcellulose, Laxative, (FIBER THERAPY PO) Take 2 capsules by mouth daily.    Yes Historical Provider, MD  triamterene-hydrochlorothiazide (DYAZIDE) 37.5-25 MG capsule Take 1 capsule by mouth daily.   Yes Historical Provider, MD  ibuprofen (ADVIL,MOTRIN) 800 MG tablet Take 1 tablet (800 mg total) by mouth 3 (three) times daily. 04/03/15   Seona Clemenson C Kary Sugrue, PA-C  methocarbamol (ROBAXIN) 500 MG tablet Take 1 tablet (500 mg total) by mouth 2 (two) times daily. 04/03/15   Lakeishia Truluck C Ladislao Cohenour, PA-C  oxyCODONE-acetaminophen (PERCOCET/ROXICET) 5-325 MG tablet Take 2 tablets by mouth every 4 (four) hours as needed for severe pain. 04/03/15   Lovis More C Bren Steers, PA-C   BP 153/79 mmHg  Pulse 85  Temp(Src) 99.7 F (37.6 C) (  Oral)  Resp 16  SpO2 93% Physical Exam  Constitutional: She appears well-developed and well-nourished. No distress.  HENT:  Head: Normocephalic and atraumatic.  Eyes: Conjunctivae are normal. Pupils are equal, round, and reactive to light.  Neck: Normal range of motion. Neck supple.  Cardiovascular: Normal rate, regular rhythm and normal heart sounds.   Pulmonary/Chest: Effort normal and breath sounds normal. No respiratory distress.  Abdominal: Soft. Bowel sounds  are normal.  Musculoskeletal: She exhibits no edema or tenderness.  Lymphadenopathy:    She has no cervical adenopathy.  Neurological: She is alert.  Skin: Skin is warm and dry. She is not diaphoretic.  Nursing note and vitals reviewed.   ED Course  Procedures (including critical care time) Labs Review Labs Reviewed  CBC WITH DIFFERENTIAL/PLATELET - Abnormal; Notable for the following:    Hemoglobin 11.6 (*)    All other components within normal limits  BASIC METABOLIC PANEL - Abnormal; Notable for the following:    Chloride 98 (*)    Glucose, Bld 241 (*)    Creatinine, Ser 1.13 (*)    Calcium 10.4 (*)    GFR calc non Af Amer 55 (*)    All other components within normal limits  CK - Abnormal; Notable for the following:    Total CK 237 (*)    All other components within normal limits  HEPATIC FUNCTION PANEL - Abnormal; Notable for the following:    Bilirubin, Direct <0.1 (*)    All other components within normal limits  LIPASE, BLOOD - Abnormal; Notable for the following:    Lipase 154 (*)    All other components within normal limits    Imaging Review US Abdomen Limited  04/03/2015  CLINICAL DATA:  Right upper quadrant abdominal pain for 1 week. History of a cholecystectomy. EXAM: US ABDOMEN LIMITED - RIGHT UPPER QUADRANT COMPARISON:  None. FINDINGS: Gallbladder: Surgically absent. Common bile duct: Diameter: 4.4 mm. Liver: Increased echogenicity with a coarsened echotexture and decreased through transmission of the sound beam. No mass or focal lesion. Hepatopetal flow documented in the portal vein. IMPRESSION: 1. Extensive hepatic steatosis. 2. No acute findings. Status post cholecystectomy. No bile duct dilation. Electronically Signed   By: Lajean Manes M.D.   On: 04/03/2015 21:07   I have personally reviewed and evaluated these images and lab results as part of my medical decision-making.   EKG Interpretation None      MDM   Final diagnoses:  Body aches     Erika Nielsen presents with generalized body aches since starting two new medications this week.  Findings and plan of care discussed with Sherwood Gambler, MD.  Patient is nontoxic appearing and in no apparent distress. Patient's presentation is consistent with a known adverse effect of muscle cramps with metformin, although I would've expected that the patient's symptoms would've at least reduced with reduction in the dosage. 7:45 PM patient now complains of an intense right upper quadrant abdominal cramping. Patient is still nontoxic appearing. Patient is post cholecystectomy. Patient has nondescript tenderness in the right upper quadrant. Tenderness and pain in this area subsided rather quickly after it began. 9:58 PM patient states that she is feeling much better after the muscle relaxer and ibuprofen. Patient was given instructions for follow-up with her PCP as soon as possible as well as return precautions. Patient voiced understanding of these instructions, agreed to the plan, and is comfortable with discharge.  Filed Vitals:   04/03/15 1845 04/03/15 1900 04/03/15 1930 04/03/15  2000  BP: 158/88 153/87 149/90 153/79  Pulse: 82 89 85 85  Temp:      TempSrc:      Resp:  16    SpO2: 97% 99% 97% 93%     Lorayne Bender, PA-C 04/03/15 2159  Sherwood Gambler, MD 04/04/15 [phone]

## 2015-04-03 NOTE — Discharge Instructions (Signed)
You have been seen today for body aches. Your imaging and lab tests showed no abnormalities. Follow up with PCP as soon as possible for chronic management of this issue and medication evaluation. Return to ED should symptoms worsen.

## 2015-04-03 NOTE — ED Notes (Signed)
Pt called out screaming and complaining of sharp RUQ abd pain. This RN in room. Pt states that this sharp pain has been coming on and off all week.

## 2015-04-03 NOTE — ED Notes (Signed)
Pt verbalized understanding of d/c instructions and has no further questions. Pt stable and going home in a cab.

## 2015-04-03 NOTE — ED Notes (Signed)
MD at bedside. 

## 2015-05-14 ENCOUNTER — Encounter: Payer: Self-pay | Admitting: Internal Medicine

## 2015-05-14 ENCOUNTER — Ambulatory Visit (INDEPENDENT_AMBULATORY_CARE_PROVIDER_SITE_OTHER): Payer: Self-pay | Admitting: Internal Medicine

## 2015-05-14 VITALS — BP 134/86 | HR 78 | Ht 64.5 in | Wt 224.0 lb

## 2015-05-14 DIAGNOSIS — Z79899 Other long term (current) drug therapy: Secondary | ICD-10-CM

## 2015-05-14 DIAGNOSIS — I10 Essential (primary) hypertension: Secondary | ICD-10-CM

## 2015-05-14 DIAGNOSIS — E119 Type 2 diabetes mellitus without complications: Secondary | ICD-10-CM

## 2015-05-14 DIAGNOSIS — E1169 Type 2 diabetes mellitus with other specified complication: Secondary | ICD-10-CM

## 2015-05-14 DIAGNOSIS — E669 Obesity, unspecified: Secondary | ICD-10-CM

## 2015-05-14 MED ORDER — GLUCOSE BLOOD VI STRP: ORAL_STRIP | Status: DC

## 2015-05-14 MED ORDER — GLIPIZIDE 5 MG PO TABS: 5.0000 mg | ORAL_TABLET | Freq: Two times a day (BID) | ORAL | Status: DC

## 2015-05-14 MED ORDER — BLOOD GLUCOSE MONITOR KIT: PACK | Status: AC

## 2015-05-14 MED ORDER — METFORMIN HCL ER (MOD) 500 MG PO TB24: 500.0000 mg | ORAL_TABLET | Freq: Every day | ORAL | Status: DC

## 2015-05-14 NOTE — Patient Instructions (Signed)
Drink a glass of water before every meal Drink 6-8 glasses of water daily Eat three meals daily Eat a protein and healthy fat with every meal (eggs,fish, chicken, [place] and limit red meats) Eat 5 servings of vegetables daily, mix the colors Eat 2 servings of fruit daily with skin, if skin is edible Use smaller plates Put food/utensils down as you chew and swallow each bite Eat at a table with friends/family at least once daily, no TV Do not eat in front of the TV  Ferrous sulfate 325 mg daily with half an orange  Tea Tree Oil lotion for feet after shower--not between toes.

## 2015-05-14 NOTE — Progress Notes (Signed)
   Subjective:    Patient ID: Erika Nielsen, female    DOB: [birth year], 55 y.o.   MRN: [id number]  HPI   1.  Elevated Blood Glucose of 282 back in December 8.  Was also seen in ED with glucose above 200.   Was given Metformin over the phone by her physican at [facility].   Up to urinate at night 2-3 times for about 1 year.  Sometimes, feels thirsty. History of hyperlipidemia for which she was on unknown medication back before 2013 when went to [facility].    2.  Essential Hypertension:  Taking Dyazide with good results.  Meds:  ASA 325 mg daily Metformin XR 500 mg daily with breakfast. Triamterene-HCTZ 37.5 mg/25 mg daily Fiber Laxative daily Calcium Carbonate -Vitamin D twice daily   Allergies:  NKDA      Review of Systems     Objective:   Physical Exam NAD HEENT:  PERRL, EOMI, discs sharp, TMs pearly gray, dental decay.  Throat without injection. Neck:  Supple, No adenopathy or thyromegaly Chest:  CTA CV:  RRR with normal S1 and S2, No S3, S4, or murmur appreciated.  No carotid bruit, carotid, radial and DP pulses normal and equal Abd:  S, NT, No HSM or masses.  +BS Extrems:  Callousing of feet.  No ulcers       Assessment & Plan:  1.  NIDDM:  Add Glipizide 5 mg twice daily.  Continue Metformin. Rx to [facility] for glucometer and test strips to check sugars twice daily To work on diet and daily physical activity for weight loss. CMP and A1C Discussed daily foot evaluation and foot care.  2.  Essential Hypertension:  Controlled.  Continue Dyazide.  CMP

## 2015-05-15 LAB — COMPREHENSIVE METABOLIC PANEL
ALT: 32 IU/L (ref 0–32)
AST: 28 IU/L (ref 0–40)
Albumin/Globulin Ratio: 1.4 (ref 1.1–2.5)
Albumin: 4.7 g/dL (ref 3.5–5.5)
Alkaline Phosphatase: 82 IU/L (ref 39–117)
BUN/Creatinine Ratio: 19 (ref 9–23)
BUN: 22 mg/dL (ref 6–24)
Bilirubin Total: 0.3 mg/dL (ref 0.0–1.2)
CALCIUM: 10.8 mg/dL — AB (ref 8.7–10.2)
CO2: 25 mmol/L (ref 18–29)
CREATININE: 1.15 mg/dL — AB (ref 0.57–1.00)
Chloride: 94 mmol/L — ABNORMAL LOW (ref 96–106)
GFR, EST AFRICAN AMERICAN: 63 mL/min/{1.73_m2} (ref >59)
GFR, EST NON AFRICAN AMERICAN: 54 mL/min/{1.73_m2} — AB (ref >59)
GLUCOSE: 173 mg/dL — AB (ref 65–99)
Globulin, Total: 3.3 g/dL (ref 1.5–4.5)
Potassium: 4.3 mmol/L (ref 3.5–5.2)
Sodium: 139 mmol/L (ref 134–144)
TOTAL PROTEIN: 8 g/dL (ref 6.0–8.5)

## 2015-05-15 LAB — HGB A1C W/O EAG: Hgb A1c MFr Bld: 9.4 % — ABNORMAL HIGH (ref 4.8–5.6)

## 2015-05-30 ENCOUNTER — Telehealth: Payer: Self-pay | Admitting: Internal Medicine

## 2015-05-30 MED ORDER — TRIAMTERENE-HCTZ 37.5-25 MG PO CAPS: 1.0000 | ORAL_CAPSULE | Freq: Every day | ORAL | Status: DC

## 2015-05-30 NOTE — Telephone Encounter (Signed)
Patient informed. 

## 2015-05-30 NOTE — Telephone Encounter (Signed)
Patient called requesting medication refill for blood pressure triamterene-hydrochlorothiazide (DYAZIDE) 37.5-25 MG capsule.  Patient states she does not have anymore pills left.  Please send to [facility] [address]

## 2015-06-11 ENCOUNTER — Ambulatory Visit: Payer: Self-pay | Admitting: Internal Medicine

## 2015-06-17 ENCOUNTER — Encounter: Payer: Self-pay | Admitting: Internal Medicine

## 2015-06-17 ENCOUNTER — Ambulatory Visit (INDEPENDENT_AMBULATORY_CARE_PROVIDER_SITE_OTHER): Payer: Self-pay | Admitting: Internal Medicine

## 2015-06-17 VITALS — BP 130/80 | HR 76 | Ht 64.5 in | Wt 224.0 lb

## 2015-06-17 DIAGNOSIS — I10 Essential (primary) hypertension: Secondary | ICD-10-CM

## 2015-06-17 DIAGNOSIS — E1165 Type 2 diabetes mellitus with hyperglycemia: Secondary | ICD-10-CM

## 2015-06-17 DIAGNOSIS — L309 Dermatitis, unspecified: Secondary | ICD-10-CM

## 2015-06-17 LAB — GLUCOSE, POCT (MANUAL RESULT ENTRY): POC GLUCOSE: 136 mg/dL — AB (ref 70–99)

## 2015-06-17 MED ORDER — TRIAMCINOLONE ACETONIDE 0.1 % EX CREA: 1.0000 "application " | TOPICAL_CREAM | Freq: Two times a day (BID) | CUTANEOUS | Status: DC

## 2015-06-17 MED ORDER — METFORMIN HCL ER 500 MG PO TB24: ORAL_TABLET | ORAL | Status: DC

## 2015-06-17 MED ORDER — LISINOPRIL-HYDROCHLOROTHIAZIDE 10-12.5 MG PO TABS: 1.0000 | ORAL_TABLET | Freq: Every day | ORAL | Status: DC

## 2015-06-17 NOTE — Patient Instructions (Signed)
Call for bp check and lab appt  To be scheduled one week after you start the Lisinopril/HCTZ blood pressure medicine

## 2015-06-17 NOTE — Progress Notes (Signed)
   Subjective:    Patient ID: Erika Nielsen, female    DOB: 1960/12/23, 55 y.o.   MRN: [id number]  HPI   1.  DM  II:  Sugars running high in general.  No record of sugars brought to visit Has not had an eye appointment in years.   2.  Essential Hypertension:  Dyazide cost went when way up.  Found a discount card to purchase at lower amount, but still $13.  Up every hour at night urinating.  Meds 1.   ASA 325 mg daily 2.  Calcium Carbonate/Vitamin D.  1 tab twice daily 3.  Glipizide 5 mg 1 tab twice daily 4.  Dyazide 1 tab daily 5.  Robaxin 500 mg 1 tab twice daily as needed 6.  Fiber laxative capsules once daily 7.  Metformin XR 500 mg once daily  No Known Allergies   Review of Systems     Objective:   Physical Exam  NAD Lungs:  CTA CV: RRR withouit murmur or rub, radial pulses normal and equal  Cracked dry skin with some pigment loss right lateral ankle. Right foot with hyperpigmented thickening medial arch area.    Assessment & Plan:  1.  DM:  Increase Metformin ER to another dose of 500 mg at evening meal. Needs eye and foot referral  2.  Essential Hypertension:  Dyazide Rx is now 13-20 dollars at [facility]. Has never taken an ACE I.  Would be willing to switch to Lisinopril/HCTZ--should be $4.   3.  Eczema:  Triamcinolone cream.  Doesn't like Dove soap--suggested Caress instead.  Eucerin with eczema relief all over after bathing.

## 2015-07-22 ENCOUNTER — Ambulatory Visit: Payer: Self-pay | Admitting: Internal Medicine

## 2015-07-23 ENCOUNTER — Telehealth: Payer: Self-pay | Admitting: Internal Medicine

## 2015-07-23 NOTE — Telephone Encounter (Signed)
Erika Nielsen called today to request a letter she needs from Dr. Amil Amen stating Erika Nielsen is diabetic and all her health problems. Erika Nielsen states she is trying to get assistance to pay her gas bill and the letter from her doctor is one of the requirements to get it and keep her gas on at home. Erika Nielsen would like for Dr. Amil Amen to have the letter ready for her at least next week if possible.

## 2015-07-24 NOTE — Telephone Encounter (Signed)
Patient informed letter is ready for pick up 07/28/15 first thing in the morning

## 2015-07-28 NOTE — Telephone Encounter (Signed)
Called patient again today. Patient states she is working  and will be off at 11:30 am and will come get letter as soon as she is out of work

## 2015-07-31 ENCOUNTER — Encounter: Payer: Self-pay | Admitting: Internal Medicine

## 2015-07-31 ENCOUNTER — Ambulatory Visit (INDEPENDENT_AMBULATORY_CARE_PROVIDER_SITE_OTHER): Payer: Self-pay | Admitting: Internal Medicine

## 2015-07-31 VITALS — BP 120/68 | HR 76 | Resp 16 | Ht 64.5 in | Wt 224.5 lb

## 2015-07-31 DIAGNOSIS — I10 Essential (primary) hypertension: Secondary | ICD-10-CM

## 2015-07-31 DIAGNOSIS — E1169 Type 2 diabetes mellitus with other specified complication: Secondary | ICD-10-CM

## 2015-07-31 DIAGNOSIS — E669 Obesity, unspecified: Secondary | ICD-10-CM

## 2015-07-31 DIAGNOSIS — E119 Type 2 diabetes mellitus without complications: Secondary | ICD-10-CM

## 2015-07-31 LAB — GLUCOSE, POCT (MANUAL RESULT ENTRY): POC GLUCOSE: 121 mg/dL — AB (ref 70–99)

## 2015-07-31 NOTE — Patient Instructions (Signed)
Keep up the good work

## 2015-07-31 NOTE — Progress Notes (Signed)
Subjective:    Patient ID: Erika Nielsen, female    DOB: 1960/06/21, 55 y.o.   MRN: [id number]  HPI   1.  DM:  Checking sugars regularly.  Is walking a lot now and feels she is doing better because of this.  Sugars in morning are generally mid 100s to mid 200s, mainly below 200.  Lunch time sugars are excellent below 100 and to 140  Dinner sugars are much like morning fasting sugars.   Is taking her food with her. Drinking lots of water.   Needs eye appt. Does not check feet nightly  2.  Essential Hypertension:  Tolerating Lisinopril and HCTZ combo fine.  Very physically active now.     Current outpatient prescriptions:  .  aspirin 325 MG tablet, Take 325 mg by mouth daily., Disp: , Rfl:  .  Calcium Carbonate-Vitamin D (CALCIUM + D PO), Take 1 tablet by mouth 2 (two) times daily. , Disp: , Rfl:  .  ferrous sulfate 325 (65 FE) MG tablet, Take 325 mg by mouth daily with breakfast., Disp: , Rfl:  .  glipiZIDE (GLUCOTROL) 5 MG tablet, Take 1 tablet (5 mg total) by mouth 2 (two) times daily before a meal., Disp: 60 tablet, Rfl: 11 .  lisinopril-hydrochlorothiazide (PRINZIDE,ZESTORETIC) 10-12.5 MG tablet, Take 1 tablet by mouth daily., Disp: 30 tablet, Rfl: 11 .  metFORMIN (GLUCOPHAGE-XR) 500 MG 24 hr tablet, 1 tab by mouth twice daily with meals, Disp: 60 tablet, Rfl: 11 .  Methylcellulose, Laxative, (FIBER THERAPY PO), Take 2 capsules by mouth daily. , Disp: , Rfl:  .  triamcinolone cream (KENALOG) 0.1 %, Apply 1 application topically 2 (two) times daily., Disp: 80 g, Rfl: 1 .  albuterol (PROVENTIL HFA;VENTOLIN HFA) 108 (90 BASE) MCG/ACT inhaler, Inhale 2 puffs into the lungs 4 (four) times daily. (Patient not taking: Reported on 05/14/2015), Disp: 1 Inhaler, Rfl: 0 .  blood glucose meter kit and supplies KIT, Check blood sugars once to twice daily--especially before breakfast, Disp: 1 each, Rfl: 0 .  glucose blood test strip, Twice daily glucose testing, Disp: 100 each, Rfl: 11 .  ibuprofen  (ADVIL,MOTRIN) 800 MG tablet, Take 1 tablet (800 mg total) by mouth 3 (three) times daily. (Patient not taking: Reported on 07/31/2015), Disp: 21 tablet, Rfl: 0 .  methocarbamol (ROBAXIN) 500 MG tablet, Take 1 tablet (500 mg total) by mouth 2 (two) times daily. (Patient not taking: Reported on 07/31/2015), Disp: 20 tablet, Rfl: 0 .  oxyCODONE-acetaminophen (PERCOCET/ROXICET) 5-325 MG tablet, Take 2 tablets by mouth every 4 (four) hours as needed for severe pain. (Patient not taking: Reported on 05/14/2015), Disp: 6 tablet, Rfl: 0   No Known Allergies  Review of Systems     Objective:   Physical Exam   HEENT:  PERRL, EOMI, difficulty seeing discs as pt. Cannot keep eyes open.  TMs pearly gray, throat without injection. Neck:  Supple, no adenopathy, generous thyroid, no definite nodule Chest:  CTA CV:  RRR without murmur or rub, radial and DP pulses normal and equal Extrems:  No edema. Patch of dry cracked skin at lateral aspect of right ankle.  No erythema. Diabetic foot exam:  Normal DP and PT pulses.  Good cap refill, 1 cm bunion on instep of right foot, NT, Mild dryness of left plantar foot.  10 g monofilament normal  Generous thyroid      Assessment & Plan:  1.  DM:  Sugars appear a bit better, but still higher than  would like.  Pt. Historically with intolerance of regular release Metformin.  Hold on increasing until see if A1C above 7.0% in 1 month.  She is really working on increasing her physical activity as well, so may not need to do so. Diabetic eye evaluation ordered.  2.  Essential Hypertension: Much improved with change to Lisinopril/HCTZ and her increased physical activity.  3.  Generous Thyroid on exam:  TSH with labs in one month  Return in 1 month for fasting labs:  FLP, CMP, A1C, urine microalbumin, TSH

## 2015-08-29 ENCOUNTER — Other Ambulatory Visit: Payer: Self-pay

## 2015-08-29 ENCOUNTER — Other Ambulatory Visit: Payer: Self-pay | Admitting: Internal Medicine

## 2015-08-29 DIAGNOSIS — E1169 Type 2 diabetes mellitus with other specified complication: Secondary | ICD-10-CM

## 2015-08-29 DIAGNOSIS — E669 Obesity, unspecified: Secondary | ICD-10-CM

## 2015-08-29 DIAGNOSIS — E785 Hyperlipidemia, unspecified: Secondary | ICD-10-CM

## 2015-08-29 DIAGNOSIS — I10 Essential (primary) hypertension: Secondary | ICD-10-CM

## 2015-08-29 DIAGNOSIS — Z79899 Other long term (current) drug therapy: Secondary | ICD-10-CM

## 2015-08-29 DIAGNOSIS — E010 Iodine-deficiency related diffuse (endemic) goiter: Secondary | ICD-10-CM

## 2015-08-30 LAB — COMPREHENSIVE METABOLIC PANEL
ALT: 16 IU/L (ref 0–32)
AST: 16 IU/L (ref 0–40)
Albumin/Globulin Ratio: 1.4 (ref 1.2–2.2)
Albumin: 4.3 g/dL (ref 3.5–5.5)
Alkaline Phosphatase: 69 IU/L (ref 39–117)
BUN / CREAT RATIO: 21 (ref 9–23)
BUN: 19 mg/dL (ref 6–24)
Bilirubin Total: 0.3 mg/dL (ref 0.0–1.2)
CALCIUM: 9.8 mg/dL (ref 8.7–10.2)
CO2: 23 mmol/L (ref 18–29)
CREATININE: 0.9 mg/dL (ref 0.57–1.00)
Chloride: 101 mmol/L (ref 96–106)
GFR, EST AFRICAN AMERICAN: 84 mL/min/{1.73_m2} (ref >59)
GFR, EST NON AFRICAN AMERICAN: 73 mL/min/{1.73_m2} (ref >59)
GLOBULIN, TOTAL: 3 g/dL (ref 1.5–4.5)
Glucose: 182 mg/dL — ABNORMAL HIGH (ref 65–99)
POTASSIUM: 4.3 mmol/L (ref 3.5–5.2)
SODIUM: 141 mmol/L (ref 134–144)
Total Protein: 7.3 g/dL (ref 6.0–8.5)

## 2015-08-30 LAB — LIPID PANEL W/O CHOL/HDL RATIO
CHOLESTEROL TOTAL: 188 mg/dL (ref 100–199)
HDL: 49 mg/dL (ref >39)
LDL CALC: 104 mg/dL — AB (ref 0–99)
Triglycerides: 176 mg/dL — ABNORMAL HIGH (ref 0–149)
VLDL CHOLESTEROL CAL: 35 mg/dL (ref 5–40)

## 2015-08-30 LAB — TSH: TSH: 1.79 u[IU]/mL (ref 0.450–4.500)

## 2015-08-30 LAB — HGB A1C W/O EAG: Hgb A1c MFr Bld: 8.3 % — ABNORMAL HIGH (ref 4.8–5.6)

## 2015-09-03 ENCOUNTER — Encounter: Payer: Self-pay | Admitting: Internal Medicine

## 2015-09-03 ENCOUNTER — Ambulatory Visit (INDEPENDENT_AMBULATORY_CARE_PROVIDER_SITE_OTHER): Payer: Self-pay | Admitting: Internal Medicine

## 2015-09-03 VITALS — BP 132/88 | HR 64 | Resp 20 | Ht 64.5 in | Wt 223.0 lb

## 2015-09-03 DIAGNOSIS — E11620 Type 2 diabetes mellitus with diabetic dermatitis: Secondary | ICD-10-CM

## 2015-09-03 DIAGNOSIS — F410 Panic disorder [episodic paroxysmal anxiety] without agoraphobia: Secondary | ICD-10-CM

## 2015-09-03 DIAGNOSIS — E1165 Type 2 diabetes mellitus with hyperglycemia: Secondary | ICD-10-CM

## 2015-09-03 DIAGNOSIS — E785 Hyperlipidemia, unspecified: Secondary | ICD-10-CM

## 2015-09-03 LAB — GLUCOSE, POCT (MANUAL RESULT ENTRY): POC GLUCOSE: 152 mg/dL — AB (ref 70–99)

## 2015-09-03 MED ORDER — ATORVASTATIN CALCIUM 20 MG PO TABS: ORAL_TABLET | ORAL | Status: DC

## 2015-09-03 MED ORDER — SERTRALINE HCL 50 MG PO TABS
ORAL_TABLET | ORAL | Status: DC
Start: 2015-09-03 — End: 2015-09-23

## 2015-09-03 NOTE — Progress Notes (Signed)
   Subjective:    Patient ID: Erika Nielsen, female    DOB: 03/06/1961, 55 y.o.   MRN: [id number]  HPI    1.  Panic Attacks when driving, especially when comes to a stop light.  Maybe for past 6 months.  Head starts--feels dizzy, butterflies in stomach, palpitations, sweaty,  Then starts crying.   Struggling with gas bills for house--does have her stressed out for the past month.  2.  DM:  A1C was 8.3% last week.  She is taking Metformin ER 500 mg only once daily.  Also taking Glipizide 5 mg twice daily with meals.  Could use more physical activity.  3.  Hyperlipidmia: Was on something for this about 5 years ago when LDL was below 70.  Cannot remember what it was. Willing to restart medication, but also discussed increasing physical activity to help this as well as DM and as a stress reliever.  Meds: ASA 325 mg once daily Calcium Carbonate/Vitamin D 1 tab twice daily Ferrous sulfate 325 mg once daily Glipizide 5 mg twice daily Lisinopril-HCTZ 10/12.5 mg once daily Metformin XR 500 mg once daily Fiber laxative once daily Triamcinolone cream 0.1 % once daily to affected area  No Known Allergies      Review of Systems     Objective:   Physical Exam Anxious appearing at times. Lungs:  CTA CV:  RRR without murmur or rub, radial pulses normal and equal.       Assessment & Plan:  1.  DM Type 2:  Increase Metformin XR 500 mg to twice daily dosing.  Encouraged continued improvement to get A1C less than 7.0%.  Seems motivated  2.  Essential Hypertension:  Remains controlled on Lisinopril HCTZ  3.  Panic Disorder:  Referral to Macie Burows, LCSW for counseling and behavior modification.  Increase physical activity for stress relief.  Start Sertraline 50 mg half tab daily for 7 days, then increase to 50 mg.  Follow up with me in 1-2 weeks.  To call or go to ED if suicidal ideation.  4.  Hyperlipidemia:  Not at goal.  Add Atorvastatin 20 mg daily.  Repeat FLP in 2 months with A1C and  CMP

## 2015-09-03 NOTE — Progress Notes (Signed)
Patient informed. KP

## 2015-09-23 ENCOUNTER — Encounter: Payer: Self-pay | Admitting: Internal Medicine

## 2015-09-23 ENCOUNTER — Ambulatory Visit (INDEPENDENT_AMBULATORY_CARE_PROVIDER_SITE_OTHER): Payer: Self-pay | Admitting: Internal Medicine

## 2015-09-23 VITALS — BP 136/90 | HR 82 | Resp 18 | Ht 64.5 in | Wt 226.0 lb

## 2015-09-23 DIAGNOSIS — F410 Panic disorder [episodic paroxysmal anxiety] without agoraphobia: Secondary | ICD-10-CM

## 2015-09-23 DIAGNOSIS — M25512 Pain in left shoulder: Secondary | ICD-10-CM

## 2015-09-23 DIAGNOSIS — E11620 Type 2 diabetes mellitus with diabetic dermatitis: Secondary | ICD-10-CM

## 2015-09-23 LAB — GLUCOSE, POCT (MANUAL RESULT ENTRY): POC GLUCOSE: 65 mg/dL — AB (ref 70–99)

## 2015-09-23 MED ORDER — SERTRALINE HCL 50 MG PO TABS: ORAL_TABLET | ORAL | Status: DC

## 2015-09-23 NOTE — Progress Notes (Signed)
Subjective:    Patient ID: Erika Nielsen, female    DOB: 17-Dec-1960, 55 y.o.   MRN: [id number]  HPI   1.  Panic Attacks:  None since started Sertraline.  No problems with the medicine.   Has not returned N. Knight's phone calls for an appt to start counseling, behavioral modification.  2.  DM:  Tolerating increase of Metformin ER to twice daily.  Sugars stiil in high 100s.  Not clear she is really working on her diet.  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  3.  Left shoulder pain:  States had problems with this in the past just before visiting [place] over 1 year ago in March 2016.  Lasted weeks then and [place].   Pain started 7 days ago when awakened in the morning with it.  Does not recall any repetative motion with the shoulder prior to the pain.  Was off her Metformin for 3 days prior to the pain starting and thought that might have something to do with it.  Has been taking regularly since.    Current outpatient prescriptions:  .  aspirin 325 MG tablet, Take 325 mg by mouth daily., Disp: , Rfl:  .  atorvastatin (LIPITOR) 20 MG tablet, 1 tab by mouth with evening meal once daily, Disp: 30 tablet, Rfl: 11 .  blood glucose meter kit and supplies KIT, Check blood sugars once to twice daily--especially before breakfast, Disp: 1 each, Rfl: 0 .  Calcium Carbonate-Vitamin D (CALCIUM + D PO), Take 1 tablet by mouth 2 (two) times daily. , Disp: , Rfl:  .  ferrous sulfate 325 (65 FE) MG tablet, Take 325 mg by mouth daily with breakfast., Disp: , Rfl:  .  glipiZIDE (GLUCOTROL) 5 MG tablet, Take 1 tablet (5 mg total) by mouth 2 (two) times daily before a meal., Disp: 60 tablet, Rfl: 11 .  glucose blood test strip, Twice daily glucose testing, Disp: 100 each, Rfl: 11 .  ibuprofen (ADVIL,MOTRIN) 800 MG tablet, Take 1 tablet (800 mg total) by mouth 3 (three) times daily., Disp: 21 tablet, Rfl: 0 .  lisinopril-hydrochlorothiazide  (PRINZIDE,ZESTORETIC) 10-12.5 MG tablet, Take 1 tablet by mouth daily., Disp: 30 tablet, Rfl: 11 .  metFORMIN (GLUCOPHAGE-XR) 500 MG 24 hr tablet, 1 tab by mouth twice daily with meals, Disp: 60 tablet, Rfl: 11 .  Psyllium (METAMUCIL MULTIHEALTH FIBER PO), Take 2 capsules by mouth., Disp: , Rfl:  .  sertraline (ZOLOFT) 50 MG tablet, 1/2 tab by mouth daily for 7 days, then increase to 1 tab daily, Disp: 30 tablet, Rfl: 2 .  TEA TREE OIL EX, Apply topically as needed., Disp: , Rfl:  .  triamcinolone cream (KENALOG) 0.1 %, Apply 1 application topically 2 (two) times daily., Disp: 80 g, Rfl: 1 .  albuterol (PROVENTIL HFA;VENTOLIN HFA) 108 (90 BASE) MCG/ACT inhaler, Inhale 2 puffs into the lungs 4 (four) times daily. (Patient not taking: Reported on 05/14/2015), Disp: 1 Inhaler, Rfl: 0 .  methocarbamol (ROBAXIN) 500 MG tablet, Take 1 tablet (500 mg total) by mouth 2 (two) times daily. (Patient not taking: Reported on 07/31/2015), Disp: 20 tablet, Rfl: 0 .  oxyCODONE-acetaminophen (PERCOCET/ROXICET) 5-325 MG tablet, Take 2 tablets by mouth every 4 (four) hours as needed for severe pain. (Patient not taking: Reported on 05/14/2015), Disp: 6 tablet, Rfl: 0  No Known Allergies  Review of Systems     Objective:  Physical Exam NAD Lungs:  CTA CV:  RRR with normal S1 and S2, no murmur or rub, radial pulses normal and equal Left shoulder:  NT over trap, about scapula, CC and AC joint.  NT over subacromial bursa.  Mild tenderness at insertion of deltoid. Full voluntary ROM.  Good oppositional force with shoulder internally rotated and abducted to 90 degrees.       Assessment & Plan:  1.  Panic Disorder:  Encouraged pt. To return N. Knight's calls for appt.  Continue Sertraline.  2.  DM: tolerating increased Metformin.  Needs to work on diet and physical activity.  3.  Left shoulder pain:  ?tendinitis of deltoid.  Went over ROM exercises. To continue Ibuprofen with food and topical menthol patches.

## 2015-09-23 NOTE — Patient Instructions (Signed)
Do the swinging range of motion exercises twice daily--25 swings each direction. Call if does not gradually improve and will refer to physical therapy Make an appt. With Macie Burows, LCSW please

## 2015-11-03 ENCOUNTER — Other Ambulatory Visit: Payer: No Typology Code available for payment source

## 2015-11-06 ENCOUNTER — Other Ambulatory Visit (INDEPENDENT_AMBULATORY_CARE_PROVIDER_SITE_OTHER): Payer: Self-pay

## 2015-11-06 DIAGNOSIS — Z79899 Other long term (current) drug therapy: Secondary | ICD-10-CM

## 2015-11-06 DIAGNOSIS — E11620 Type 2 diabetes mellitus with diabetic dermatitis: Secondary | ICD-10-CM

## 2015-11-06 DIAGNOSIS — E785 Hyperlipidemia, unspecified: Secondary | ICD-10-CM

## 2015-11-07 LAB — COMPREHENSIVE METABOLIC PANEL
ALBUMIN: 3.8 g/dL (ref 3.5–5.5)
ALT: 17 IU/L (ref 0–32)
AST: 16 IU/L (ref 0–40)
Albumin/Globulin Ratio: 1.3 (ref 1.2–2.2)
Alkaline Phosphatase: 69 IU/L (ref 39–117)
BUN / CREAT RATIO: 15 (ref 9–23)
BUN: 14 mg/dL (ref 6–24)
Bilirubin Total: 0.3 mg/dL (ref 0.0–1.2)
CO2: 25 mmol/L (ref 18–29)
CREATININE: 0.93 mg/dL (ref 0.57–1.00)
Calcium: 9.4 mg/dL (ref 8.7–10.2)
Chloride: 96 mmol/L (ref 96–106)
GFR calc non Af Amer: 70 mL/min/{1.73_m2} (ref >59)
GFR, EST AFRICAN AMERICAN: 81 mL/min/{1.73_m2} (ref >59)
GLUCOSE: 181 mg/dL — AB (ref 65–99)
Globulin, Total: 3 g/dL (ref 1.5–4.5)
Potassium: 4 mmol/L (ref 3.5–5.2)
Sodium: 137 mmol/L (ref 134–144)
TOTAL PROTEIN: 6.8 g/dL (ref 6.0–8.5)

## 2015-11-07 LAB — LIPID PANEL W/O CHOL/HDL RATIO
CHOLESTEROL TOTAL: 120 mg/dL (ref 100–199)
HDL: 47 mg/dL (ref >39)
LDL CALC: 51 mg/dL (ref 0–99)
TRIGLYCERIDES: 109 mg/dL (ref 0–149)
VLDL Cholesterol Cal: 22 mg/dL (ref 5–40)

## 2015-12-24 ENCOUNTER — Encounter: Payer: Self-pay | Admitting: Internal Medicine

## 2015-12-24 ENCOUNTER — Ambulatory Visit (INDEPENDENT_AMBULATORY_CARE_PROVIDER_SITE_OTHER): Payer: No Typology Code available for payment source | Admitting: Internal Medicine

## 2015-12-24 VITALS — BP 130/78 | HR 82 | Resp 18 | Ht 64.5 in | Wt 223.0 lb

## 2015-12-24 DIAGNOSIS — E1169 Type 2 diabetes mellitus with other specified complication: Secondary | ICD-10-CM

## 2015-12-24 DIAGNOSIS — E785 Hyperlipidemia, unspecified: Secondary | ICD-10-CM | POA: Insufficient documentation

## 2015-12-24 DIAGNOSIS — I10 Essential (primary) hypertension: Secondary | ICD-10-CM

## 2015-12-24 DIAGNOSIS — E669 Obesity, unspecified: Secondary | ICD-10-CM

## 2015-12-24 DIAGNOSIS — E119 Type 2 diabetes mellitus without complications: Secondary | ICD-10-CM

## 2015-12-24 DIAGNOSIS — F410 Panic disorder [episodic paroxysmal anxiety] without agoraphobia: Secondary | ICD-10-CM

## 2015-12-24 LAB — GLUCOSE, POCT (MANUAL RESULT ENTRY): POC Glucose: 117 mg/dl — AB (ref 70–99)

## 2015-12-24 MED ORDER — SERTRALINE HCL 50 MG PO TABS: ORAL_TABLET | ORAL | 11 refills | Status: DC

## 2015-12-24 NOTE — Progress Notes (Signed)
   Subjective:    Patient ID: Erika Nielsen, female    DOB: 1961-03-10, 55 y.o.   MRN: [id number]  HPI   1.  Panic Disorder:  Took Sertraline for a month and then stopped as she states the [facility] pharmacy told her it had been stopped.  Felt well on the medication.  Panic attacks had stopped completely.  Doing fair since stopping.  2.  DM Type 2:  Sugars have gradually improved with her journal.  Taking Metformin XR twice daily. And Glipizide 5 mg twice daily.  Tolerating fine.  3.  Essential Hypertension:  Has been more physically active--client has stairs.  Has lost 3 lbs.  4.  Hyperlipidemia:  Cholesterol panel excellent in July.  Lipid Panel     Component Value Date/Time   CHOL 120 11/06/2015 0909   TRIG 109 11/06/2015 0909   HDL 47 11/06/2015 0909   CHOLHDL 3.5 Ratio 04/14/2010 2138   VLDL 34 04/14/2010 2138   LDLCALC 51 11/06/2015 0909   Current Meds  Medication Sig  . aspirin 325 MG tablet Take 325 mg by mouth daily.  Marland Kitchen atorvastatin (LIPITOR) 20 MG tablet 1 tab by mouth with evening meal once daily  . blood glucose meter kit and supplies KIT Check blood sugars once to twice daily--especially before breakfast  . Calcium Carbonate-Vitamin D (CALCIUM + D PO) Take 1 tablet by mouth 2 (two) times daily.   . ferrous sulfate 325 (65 FE) MG tablet Take 325 mg by mouth daily with breakfast.  . glipiZIDE (GLUCOTROL) 5 MG tablet Take 1 tablet (5 mg total) by mouth 2 (two) times daily before a meal.  . glucose blood test strip Twice daily glucose testing  . ibuprofen (ADVIL,MOTRIN) 800 MG tablet Take 1 tablet (800 mg total) by mouth 3 (three) times daily.  Marland Kitchen lisinopril-hydrochlorothiazide (PRINZIDE,ZESTORETIC) 10-12.5 MG tablet Take 1 tablet by mouth daily.  . metFORMIN (GLUCOPHAGE-XR) 500 MG 24 hr tablet 1 tab by mouth twice daily with meals  . Psyllium (METAMUCIL MULTIHEALTH FIBER PO) Take 2 capsules by mouth.  . TEA TREE OIL EX Apply topically as needed.  . triamcinolone cream  (KENALOG) 0.1 % Apply 1 application topically 2 (two) times daily.    No Known Allergies   Review of Systems     Objective:   Physical Exam  Lungs:  CTA CV:  RRR without murmur or rub, radial pulses normal and equal LE:  No edema Feet:  No lesions, nails trimmed nicely, Normal DP and PT pulses.  No deformity.        Assessment & Plan:  1.  DM  Type 2:  Sugars show a gradual improvement, though not clear she is at goal.  Check A1C today.  Continue to work on lifestyle changes.  2.  Essential Hypertension:  Controlled.  3.  Hyperlipidemia:  At goal with medication and lifestyle changes.    4.  Panic Attacks:  To restart Sertraline.  Not clear why this was stopped. Will call [facility] pharmacy to clarify.  Follow up in 3-4 months for CPE with pap

## 2015-12-25 LAB — HGB A1C W/O EAG: Hgb A1c MFr Bld: 6.7 % — ABNORMAL HIGH (ref 4.8–5.6)

## 2016-02-23 ENCOUNTER — Encounter: Payer: Self-pay | Admitting: Internal Medicine

## 2016-04-13 ENCOUNTER — Encounter: Payer: Self-pay | Admitting: Internal Medicine

## 2016-04-19 ENCOUNTER — Encounter: Payer: Self-pay | Admitting: Internal Medicine

## 2016-05-28 ENCOUNTER — Telehealth: Payer: Self-pay | Admitting: Internal Medicine

## 2016-05-28 ENCOUNTER — Other Ambulatory Visit: Payer: Self-pay | Admitting: Internal Medicine

## 2016-05-28 DIAGNOSIS — E1165 Type 2 diabetes mellitus with hyperglycemia: Secondary | ICD-10-CM

## 2016-05-28 NOTE — Telephone Encounter (Signed)
Rx was faxed to pharmacy.  

## 2016-05-28 NOTE — Telephone Encounter (Signed)
Patient needs a prescription refill for metFORMIN (GLUCOPHAGE-Xr) 500 MG 24 hr tablets. She is not completely out but would like to have the refill before she does.

## 2016-05-28 NOTE — Telephone Encounter (Signed)
Patient needs refill for metFORMIN (GLUCOPHAGE-Xr) 500 MG 24 hr tablet. She called earlier and I routed the message but it didn't go through. She would like the refill before she runs out of her pills.

## 2016-05-28 NOTE — Telephone Encounter (Signed)
Patient would like a refill for the metFormin which she is running low on to [facility] at [facility].

## 2016-06-02 ENCOUNTER — Other Ambulatory Visit: Payer: Self-pay | Admitting: Internal Medicine

## 2016-06-02 DIAGNOSIS — E669 Obesity, unspecified: Principal | ICD-10-CM

## 2016-06-02 DIAGNOSIS — E1169 Type 2 diabetes mellitus with other specified complication: Secondary | ICD-10-CM

## 2016-06-03 ENCOUNTER — Other Ambulatory Visit: Payer: Self-pay | Admitting: Internal Medicine

## 2016-06-03 DIAGNOSIS — E669 Obesity, unspecified: Principal | ICD-10-CM

## 2016-06-03 DIAGNOSIS — E1169 Type 2 diabetes mellitus with other specified complication: Secondary | ICD-10-CM

## 2016-06-24 ENCOUNTER — Other Ambulatory Visit: Payer: Self-pay | Admitting: Internal Medicine

## 2016-06-24 DIAGNOSIS — I10 Essential (primary) hypertension: Secondary | ICD-10-CM

## 2016-06-25 NOTE — Telephone Encounter (Signed)
Patient called requesting medication refill for lisinopril-hydrochlorothiazide (PRINZIDE,ZESTORETIC) 10-12.5 MG tablet. Please send to [facility] on [address]

## 2016-06-25 NOTE — Telephone Encounter (Signed)
LVM

## 2016-07-22 ENCOUNTER — Ambulatory Visit (INDEPENDENT_AMBULATORY_CARE_PROVIDER_SITE_OTHER): Payer: Self-pay | Admitting: Internal Medicine

## 2016-07-22 ENCOUNTER — Encounter: Payer: Self-pay | Admitting: Internal Medicine

## 2016-07-22 VITALS — BP 138/82 | HR 78 | Resp 12 | Ht 63.0 in | Wt 208.0 lb

## 2016-07-22 DIAGNOSIS — E1165 Type 2 diabetes mellitus with hyperglycemia: Secondary | ICD-10-CM

## 2016-07-22 DIAGNOSIS — T783XXA Angioneurotic edema, initial encounter: Secondary | ICD-10-CM

## 2016-07-22 LAB — GLUCOSE, POCT (MANUAL RESULT ENTRY): POC Glucose: 94 mg/dl (ref 70–99)

## 2016-07-22 MED ORDER — AMLODIPINE BESYLATE 5 MG PO TABS: 5.0000 mg | ORAL_TABLET | Freq: Every day | ORAL | 11 refills | Status: DC

## 2016-07-22 MED ORDER — DICLOFENAC SODIUM 1 % TD GEL: 2.0000 g | Freq: Four times a day (QID) | TRANSDERMAL | Status: DC

## 2016-07-22 MED ORDER — HYDROCHLOROTHIAZIDE 12.5 MG PO CAPS
12.5000 mg | ORAL_CAPSULE | Freq: Every day | ORAL | 11 refills | Status: DC
Start: 2016-07-22 — End: 2017-08-05

## 2016-07-22 MED ORDER — FERROUS GLUCONATE 324 (38 FE) MG PO TABS: 324.0000 mg | ORAL_TABLET | Freq: Every day | ORAL | Status: AC

## 2016-07-22 NOTE — Patient Instructions (Addendum)
[facility] [id number]  [place], [place] [address] [phone] 40% off--ask for dosing for Zannie Cove with your knee arthritis  Call clinic or go to ED if increased swelling of lips, tongue, throat.

## 2016-07-22 NOTE — Progress Notes (Signed)
   Subjective:    Patient ID: Erika Nielsen, female    DOB: 1960-06-10, 56 y.o.   MRN: [id number]  HPI   Has had lip swelling and swelling of left side of face about 1 week ago, lasting 2 days.   Started up again 2 days ago.  Called clinic yesterday and we asked her to stop her Lisinopril, but she could not get into the clinic.  Did not take the medication this morning. No new exposures.   Has take Benadryl liquid 25 mg only at bedtime and her lips are much better today.    2.  Bilateral knee pain:  Wanting to try something to control knee pain.  Open to supplements  Current Meds  Medication Sig  . aspirin 325 MG tablet Take 325 mg by mouth daily.  . blood glucose meter kit and supplies KIT Check blood sugars once to twice daily--especially before breakfast  . Calcium Carbonate-Vitamin D (CALCIUM + D PO) Take 1 tablet by mouth 2 (two) times daily.   . ferrous sulfate 325 (65 FE) MG tablet Take 325 mg by mouth daily with breakfast.  . glipiZIDE (GLUCOTROL) 5 MG tablet TAKE ONE TABLET BY MOUTH TWICE DAILY BEFORE MEAL(S)  . glucose blood test strip Twice daily glucose testing  . ibuprofen (ADVIL,MOTRIN) 800 MG tablet Take 1 tablet (800 mg total) by mouth 3 (three) times daily. (Patient not taking: Reported on 09/21/2016)  . metFORMIN (GLUCOPHAGE-XR) 500 MG 24 hr tablet TAKE ONE TABLET BY MOUTH ONCE DAILY WITH BREAKFAST (Patient taking differently: 1 twice a day with meals)  . Psyllium (METAMUCIL MULTIHEALTH FIBER PO) Take 2 capsules by mouth.  . sertraline (ZOLOFT) 50 MG tablet 1 tab by mouth daily  . TEA TREE OIL EX Apply topically as needed.  . triamcinolone cream (KENALOG) 0.1 % Apply 1 application topically 2 (two) times daily.    Allergies  Allergen Reactions  . Lisinopril Swelling     Review of Systems     Objective:   Physical Exam NAD HEENT:  PERRL, EOMI, TMs pearly gray, minimal lip fullness compared to baseline.  No facial swelling noted.  Throat without injection Neck:   Supple, No adenopathy Chest:  CTA with good air movement. CV:  RRR without murmur or rub, radial pulses normal and equal. Abd:  S, NT, No HSM or mass. Knees:  Hypertrophic changes.  No joint line tenderness.  No pain or laxity with stress of cruciates or collateral ligaments.  No effusions. LE: no edema    Assessment & Plan:  1.  Angioedema:  No further ACE I/Lisinopril.  Benadryl as needed  2.  Essential Hypertension:  Amlodipine 5 mg with HCTZ 12.5 mg daily.  Follow 2 weeks with bp and pulse check and with me in 2 months  3.  Knee pain;  Refilled topical diclofenac.  To pick up Zannie Cove at [facility]

## 2016-08-04 ENCOUNTER — Ambulatory Visit: Payer: Self-pay

## 2016-08-04 VITALS — BP 140/82 | HR 68

## 2016-08-04 DIAGNOSIS — I10 Essential (primary) hypertension: Secondary | ICD-10-CM

## 2016-09-21 ENCOUNTER — Encounter: Payer: Self-pay | Admitting: Internal Medicine

## 2016-09-21 ENCOUNTER — Ambulatory Visit (INDEPENDENT_AMBULATORY_CARE_PROVIDER_SITE_OTHER): Payer: Self-pay | Admitting: Internal Medicine

## 2016-09-21 VITALS — BP 130/80 | HR 72 | Resp 12 | Ht 63.0 in | Wt 210.0 lb

## 2016-09-21 DIAGNOSIS — M25562 Pain in left knee: Secondary | ICD-10-CM

## 2016-09-21 DIAGNOSIS — I10 Essential (primary) hypertension: Secondary | ICD-10-CM

## 2016-09-21 DIAGNOSIS — T783XXD Angioneurotic edema, subsequent encounter: Secondary | ICD-10-CM

## 2016-09-21 DIAGNOSIS — E1165 Type 2 diabetes mellitus with hyperglycemia: Secondary | ICD-10-CM

## 2016-09-21 DIAGNOSIS — M25561 Pain in right knee: Secondary | ICD-10-CM

## 2016-09-21 LAB — GLUCOSE, POCT (MANUAL RESULT ENTRY): POC GLUCOSE: 78 mg/dL (ref 70–99)

## 2016-09-21 NOTE — Progress Notes (Signed)
   Subjective:    Patient ID: Erika Nielsen, female    DOB: 06-21-1960, 56 y.o.   MRN: [id number]  HPI   1.  Angioedema:  No further swelling of face or lips since last seen.  Likely related to ACE I use previously.  2.  Essential Hypertension:  No swelling of ankles with switch to Amlodipine.  Also taking low dose HCTZ 12.5 mg daily.  3.  Knee pain:  No longer having, so did not get Diclofenac gel or tart cherry.  4. DM:  Sugars generally in low 100s.  Just got back from the beach and was physically active.    Current Meds  Medication Sig  . amLODipine (NORVASC) 5 MG tablet Take 1 tablet (5 mg total) by mouth daily.  Marland Kitchen aspirin 325 MG tablet Take 325 mg by mouth daily.  Marland Kitchen atorvastatin (LIPITOR) 20 MG tablet 1 tab by mouth with evening meal once daily  . blood glucose meter kit and supplies KIT Check blood sugars once to twice daily--especially before breakfast  . Calcium Carbonate-Vitamin D (CALCIUM + D PO) Take 1 tablet by mouth 2 (two) times daily.   . diclofenac sodium (VOLTAREN) 1 % GEL Apply 2 g topically 4 (four) times daily. As needed-using friend's medication  . glipiZIDE (GLUCOTROL) 5 MG tablet TAKE ONE TABLET BY MOUTH TWICE DAILY BEFORE MEAL(S)  . glucose blood test strip Twice daily glucose testing  . hydrochlorothiazide (MICROZIDE) 12.5 MG capsule Take 1 capsule (12.5 mg total) by mouth daily. In the morning  . metFORMIN (GLUCOPHAGE-XR) 500 MG 24 hr tablet TAKE ONE TABLET BY MOUTH ONCE DAILY WITH BREAKFAST (Patient taking differently: 1 twice a day with meals)  . Psyllium (METAMUCIL MULTIHEALTH FIBER PO) Take 2 capsules by mouth.  . sertraline (ZOLOFT) 50 MG tablet 1 tab by mouth daily  . TEA TREE OIL EX Apply topically as needed.  . triamcinolone cream (KENALOG) 0.1 % Apply 1 application topically 2 (two) times daily.    Allergies  Allergen Reactions  . Lisinopril Swelling      Review of Systems     Objective:   Physical Exam   NAD Lungs:  CTA CV: RRR  without murmur or rub, radial pulses normal and equal LE:  No edema        Assessment & Plan:  1.  DM:  Sounds well controlled.  A1C and urine microalbumin/crea in next month  2.  Hyperlipidemia:  At goal last fall.  Recheck in next month  3.  Angioedema:  Discussed not to take ACE I in future.  4.  Essential Hypertension: controlled with new regimen of Amlodipine and HCTZ  5.  Knee pain : resolved

## 2016-10-06 ENCOUNTER — Other Ambulatory Visit: Payer: Self-pay

## 2016-10-06 ENCOUNTER — Telehealth: Payer: Self-pay | Admitting: Internal Medicine

## 2016-10-06 MED ORDER — ATORVASTATIN CALCIUM 20 MG PO TABS: ORAL_TABLET | ORAL | 11 refills | Status: DC

## 2016-10-06 NOTE — Telephone Encounter (Signed)
Rx faxed to pharmacy  

## 2016-10-06 NOTE — Telephone Encounter (Signed)
Patient is out of atorvastatin (LIPITOR) 20 mg.  Needs refill.

## 2016-10-25 ENCOUNTER — Other Ambulatory Visit (INDEPENDENT_AMBULATORY_CARE_PROVIDER_SITE_OTHER): Payer: Self-pay

## 2016-10-25 DIAGNOSIS — E785 Hyperlipidemia, unspecified: Secondary | ICD-10-CM

## 2016-10-25 DIAGNOSIS — Z79899 Other long term (current) drug therapy: Secondary | ICD-10-CM

## 2016-10-25 DIAGNOSIS — E1165 Type 2 diabetes mellitus with hyperglycemia: Secondary | ICD-10-CM

## 2016-10-26 LAB — LIPID PANEL W/O CHOL/HDL RATIO
CHOLESTEROL TOTAL: 140 mg/dL (ref 100–199)
HDL: 56 mg/dL (ref >39)
LDL Calculated: 61 mg/dL (ref 0–99)
Triglycerides: 116 mg/dL (ref 0–149)
VLDL CHOLESTEROL CAL: 23 mg/dL (ref 5–40)

## 2016-10-26 LAB — COMPREHENSIVE METABOLIC PANEL
ALBUMIN: 4.4 g/dL (ref 3.5–5.5)
ALK PHOS: 77 IU/L (ref 39–117)
ALT: 17 IU/L (ref 0–32)
AST: 17 IU/L (ref 0–40)
Albumin/Globulin Ratio: 1.5 (ref 1.2–2.2)
BILIRUBIN TOTAL: 0.2 mg/dL (ref 0.0–1.2)
BUN / CREAT RATIO: 19 (ref 9–23)
BUN: 15 mg/dL (ref 6–24)
CHLORIDE: 98 mmol/L (ref 96–106)
CO2: 23 mmol/L (ref 20–29)
Calcium: 9.6 mg/dL (ref 8.7–10.2)
Creatinine, Ser: 0.78 mg/dL (ref 0.57–1.00)
GFR calc Af Amer: 100 mL/min/{1.73_m2} (ref >59)
GFR calc non Af Amer: 86 mL/min/{1.73_m2} (ref >59)
Globulin, Total: 2.9 g/dL (ref 1.5–4.5)
Glucose: 137 mg/dL — ABNORMAL HIGH (ref 65–99)
Potassium: 3.9 mmol/L (ref 3.5–5.2)
Sodium: 140 mmol/L (ref 134–144)
Total Protein: 7.3 g/dL (ref 6.0–8.5)

## 2016-10-26 LAB — CBC WITH DIFFERENTIAL/PLATELET
BASOS: 1 %
Basophils Absolute: 0 10*3/uL (ref 0.0–0.2)
EOS (ABSOLUTE): 0.3 10*3/uL (ref 0.0–0.4)
EOS: 6 %
HEMATOCRIT: 30.9 % — AB (ref 34.0–46.6)
HEMOGLOBIN: 9.5 g/dL — AB (ref 11.1–15.9)
IMMATURE GRANS (ABS): 0 10*3/uL (ref 0.0–0.1)
IMMATURE GRANULOCYTES: 0 %
LYMPHS: 50 %
Lymphocytes Absolute: 3 10*3/uL (ref 0.7–3.1)
MCH: 26.5 pg — AB (ref 26.6–33.0)
MCHC: 30.7 g/dL — ABNORMAL LOW (ref 31.5–35.7)
MCV: 86 fL (ref 79–97)
Monocytes Absolute: 0.4 10*3/uL (ref 0.1–0.9)
Monocytes: 6 %
NEUTROS ABS: 2.2 10*3/uL (ref 1.4–7.0)
Neutrophils: 37 %
PLATELETS: 289 10*3/uL (ref 150–379)
RBC: 3.58 x10E6/uL — ABNORMAL LOW (ref 3.77–5.28)
RDW: 16.6 % — ABNORMAL HIGH (ref 12.3–15.4)
WBC: 5.9 10*3/uL (ref 3.4–10.8)

## 2016-10-26 LAB — HGB A1C W/O EAG: HEMOGLOBIN A1C: 6.3 % — AB (ref 4.8–5.6)

## 2017-01-25 ENCOUNTER — Other Ambulatory Visit: Payer: Self-pay

## 2017-01-25 DIAGNOSIS — F410 Panic disorder [episodic paroxysmal anxiety] without agoraphobia: Secondary | ICD-10-CM

## 2017-01-25 MED ORDER — SERTRALINE HCL 50 MG PO TABS: ORAL_TABLET | ORAL | 11 refills | Status: DC

## 2017-02-23 ENCOUNTER — Ambulatory Visit: Payer: Self-pay | Admitting: Internal Medicine

## 2017-02-23 ENCOUNTER — Encounter: Payer: Self-pay | Admitting: Internal Medicine

## 2017-02-23 VITALS — BP 132/90 | HR 82 | Resp 12 | Ht 63.0 in | Wt 216.0 lb

## 2017-02-23 DIAGNOSIS — D649 Anemia, unspecified: Secondary | ICD-10-CM

## 2017-02-23 DIAGNOSIS — I10 Essential (primary) hypertension: Secondary | ICD-10-CM

## 2017-02-23 DIAGNOSIS — B351 Tinea unguium: Secondary | ICD-10-CM

## 2017-02-23 DIAGNOSIS — E1169 Type 2 diabetes mellitus with other specified complication: Secondary | ICD-10-CM

## 2017-02-23 DIAGNOSIS — K029 Dental caries, unspecified: Secondary | ICD-10-CM

## 2017-02-23 DIAGNOSIS — Z23 Encounter for immunization: Secondary | ICD-10-CM

## 2017-02-23 DIAGNOSIS — E669 Obesity, unspecified: Secondary | ICD-10-CM

## 2017-02-23 DIAGNOSIS — F410 Panic disorder [episodic paroxysmal anxiety] without agoraphobia: Secondary | ICD-10-CM

## 2017-02-23 DIAGNOSIS — Z79899 Other long term (current) drug therapy: Secondary | ICD-10-CM

## 2017-02-23 DIAGNOSIS — E785 Hyperlipidemia, unspecified: Secondary | ICD-10-CM

## 2017-02-23 DIAGNOSIS — B353 Tinea pedis: Secondary | ICD-10-CM

## 2017-02-23 HISTORY — DX: Panic disorder (episodic paroxysmal anxiety): F41.0

## 2017-02-23 HISTORY — DX: Anemia, unspecified: D64.9

## 2017-02-23 LAB — GLUCOSE, POCT (MANUAL RESULT ENTRY): POC GLUCOSE: 81 mg/dL (ref 70–99)

## 2017-02-23 MED ORDER — TERBINAFINE HCL 250 MG PO TABS: 250.0000 mg | ORAL_TABLET | Freq: Every day | ORAL | 0 refills | Status: DC

## 2017-02-23 NOTE — Progress Notes (Signed)
Subjective:    Patient ID: Erika Nielsen, female    DOB: 1960/06/19, 56 y.o.   MRN: [id number]  HPI   1.  Anemia:  Has not followed up as requested for normocytic anemia back in mid July.  hgb was 9.5 with normocytic indices and high RDW.   Does have intermittent BRBPR.  Only on tissues and generally after straining. She thinks she might have hemorrhoid.  No melena.  No abdominal pain.  Stools are light brown and formed, sometimes hard and she needs to strain. She knows sometimes she does not follow a well rounded diet.  Did not do the stool guaiac cards.    2.  Hyperlipidemia:  Taking Atorvastatin 10 mg daily.  Tolerating fine.  Cholesterol panel was at goal in July.    3.  DM:  Last A1C was 6.3% in July.  Taking Metformin XR 500 mg daily.  Walking daily for 10-15 minutes and then another 15 minutes later in the day.  She might be able to work each up to 20 minutes twice daily. Sugars running mid 100s generally in the morning.  Her sugars in the evening are running up to low 200s.   Eats first in the morning around 9:30 a.m. Has not been taking Metformin XR twice daily.  Not clear why she thought she was only to take one in the morning.  Is taking Glipizide twice daily.   Has not had eye exam. Did get influenza at [facility].   4.  Essential Hypertension:  Taking Amlodipine, but may not be taking HCTZ.  She does not think she has that at home.  5.  Panic Attacks:  Has not seen Macie Burows, LCSW  Would be willing to.  She thinks she had these when she was out of Sertraline.  States she did not have the money to get it for 2 weeks.   She thinks this occurred in September.   Current Meds  Medication Sig  . albuterol (PROVENTIL HFA;VENTOLIN HFA) 108 (90 BASE) MCG/ACT inhaler Inhale 2 puffs into the lungs 4 (four) times daily.  Marland Kitchen amLODipine (NORVASC) 5 MG tablet Take 1 tablet (5 mg total) by mouth daily.  Marland Kitchen aspirin 325 MG tablet Take 325 mg by mouth daily.  Marland Kitchen atorvastatin (LIPITOR) 20 MG  tablet 1 tab by mouth with evening meal once daily (Patient taking differently: 1/2 tab by mouth with evening meal once daily)  . blood glucose meter kit and supplies KIT Check blood sugars once to twice daily--especially before breakfast  . Calcium Carbonate-Vitamin D (CALCIUM + D PO) Take 1 tablet by mouth 2 (two) times daily.   . diclofenac sodium (VOLTAREN) 1 % GEL Apply 2 g topically 4 (four) times daily. As needed-using friend's medication  . glipiZIDE (GLUCOTROL) 5 MG tablet TAKE ONE TABLET BY MOUTH TWICE DAILY BEFORE MEAL(S)  . glucose blood test strip Twice daily glucose testing  . hydrochlorothiazide (MICROZIDE) 12.5 MG capsule Take 1 capsule (12.5 mg total) by mouth daily. In the morning  . ibuprofen (ADVIL,MOTRIN) 800 MG tablet Take 1 tablet (800 mg total) by mouth 3 (three) times daily. (Patient taking differently: Take 800 mg every 6 (six) hours as needed by mouth. )  . metFORMIN (GLUCOPHAGE-XR) 500 MG 24 hr tablet TAKE ONE TABLET BY MOUTH ONCE DAILY WITH BREAKFAST (Patient taking differently: No sig reported)  . Psyllium (METAMUCIL MULTIHEALTH FIBER PO) Take 2 capsules by mouth.  . sertraline (ZOLOFT) 50 MG tablet 1 tab by  mouth daily  . triamcinolone cream (KENALOG) 0.1 % Apply 1 application topically 2 (two) times daily.   Allergies  Allergen Reactions  . Lisinopril Swelling        Review of Systems     Objective:   Physical Exam   NAD HEENT: PERRL, EOMI, TMs pearly gray, throat without injection.  Diffuse dental decay, dental loss, periondontal disease. Neck:  Supple, No adenopathy Chest:  CTA CV:  RRR with normal S1 and S2, No S3, S4 or murmur.  Radial pulses normal and equal. Abd:  S, NT, No HSM or mass, + BS  Diabetic Foot Exam - Simple   Simple Foot Form Diabetic Foot exam was performed with the following findings:  Yes 02/23/2017  4:30 PM  Visual Inspection See comments:  Yes Sensation Testing Intact to touch and monofilament testing bilaterally:   Yes Pulse Check Posterior Tibialis and Dorsalis pulse intact bilaterally:  Yes Comments Flaking of feet and several toenails with thickening and discoloration and flaking.          Assessment & Plan:  1.  DM:  To restart Metformin ER 500 mg twice daily with Glipizide.  Hold on rechecking A1C until back on this for 3 months. Referral for Optometry eye exam  2.  HM:  Tdap today.  3.  Hyperlipidemia:  At goal in July on Atorvastatin.  4.  Toenail onychomycosis/tinea pedis:  Terbinafine 250 mg daily.  Follow up in 6 and 12 weeks with hepatic profile.  Discussed shoe and shower care.  5.  Essential Hypertension:  To check and be sure she has HCTZ at home.  To call if she is unable to obtain.  6.  Anemia:  Iron studies.  Guaiac Cards x 3 to return completed in 2 weeks.  B12 and folate studies.  7.  Panic Attacks:  Discussed never suddenly stopping Sertraline.  She states she would be willing to work with intern [place].  Warm hand off today.

## 2017-02-23 NOTE — Patient Instructions (Addendum)
Gold bond foot cream--mix with Tea Tree oil and massage into feet and toenails twice daily.  For toenail fungus:    Spray inside of all shoes and allow to dry when first start Terbinafine Spray inside of shoes after each use and allow to dry before wearing again Clean shower stall twice weekly with cleaner containing bleach Return in 6 weeks for recheck of blood Return in 12 weeks to see Dr. Amil Amen

## 2017-02-23 NOTE — Progress Notes (Signed)
Social Worker Intern did a warm hand off with patient. Physician disclosed the patient had been having panic attacks. SWI offered counseling services, pt was willing to come to counseling. Session is scheduled for next week.

## 2017-02-24 LAB — CBC WITH DIFFERENTIAL/PLATELET
BASOS: 1 %
Basophils Absolute: 0 10*3/uL (ref 0.0–0.2)
EOS (ABSOLUTE): 0.2 10*3/uL (ref 0.0–0.4)
EOS: 2 %
HEMATOCRIT: 32.4 % — AB (ref 34.0–46.6)
HEMOGLOBIN: 10.4 g/dL — AB (ref 11.1–15.9)
IMMATURE GRANS (ABS): 0 10*3/uL (ref 0.0–0.1)
IMMATURE GRANULOCYTES: 0 %
LYMPHS: 39 %
Lymphocytes Absolute: 3.2 10*3/uL — ABNORMAL HIGH (ref 0.7–3.1)
MCH: 26.6 pg (ref 26.6–33.0)
MCHC: 32.1 g/dL (ref 31.5–35.7)
MCV: 83 fL (ref 79–97)
MONOCYTES: 6 %
Monocytes Absolute: 0.5 10*3/uL (ref 0.1–0.9)
NEUTROS PCT: 52 %
Neutrophils Absolute: 4.3 10*3/uL (ref 1.4–7.0)
Platelets: 344 10*3/uL (ref 150–379)
RBC: 3.91 x10E6/uL (ref 3.77–5.28)
RDW: 16.6 % — AB (ref 12.3–15.4)
WBC: 8.1 10*3/uL (ref 3.4–10.8)

## 2017-02-24 LAB — VITAMIN B12: VITAMIN B 12: 1591 pg/mL — AB (ref 232–1245)

## 2017-02-24 LAB — FOLATE: Folate: >20 ng/mL (ref >3.0)

## 2017-02-24 LAB — COMPREHENSIVE METABOLIC PANEL
A/G RATIO: 1.3 (ref 1.2–2.2)
ALT: 21 IU/L (ref 0–32)
AST: 19 IU/L (ref 0–40)
Albumin: 4.6 g/dL (ref 3.5–5.5)
Alkaline Phosphatase: 86 IU/L (ref 39–117)
BUN/Creatinine Ratio: 20 (ref 9–23)
BUN: 17 mg/dL (ref 6–24)
Bilirubin Total: 0.4 mg/dL (ref 0.0–1.2)
CALCIUM: 10.7 mg/dL — AB (ref 8.7–10.2)
CO2: 26 mmol/L (ref 20–29)
CREATININE: 0.85 mg/dL (ref 0.57–1.00)
Chloride: 99 mmol/L (ref 96–106)
GFR, EST AFRICAN AMERICAN: 90 mL/min/{1.73_m2} (ref >59)
GFR, EST NON AFRICAN AMERICAN: 78 mL/min/{1.73_m2} (ref >59)
GLOBULIN, TOTAL: 3.5 g/dL (ref 1.5–4.5)
Glucose: 73 mg/dL (ref 65–99)
Potassium: 3.8 mmol/L (ref 3.5–5.2)
SODIUM: 142 mmol/L (ref 134–144)
TOTAL PROTEIN: 8.1 g/dL (ref 6.0–8.5)

## 2017-02-24 LAB — MICROALBUMIN / CREATININE URINE RATIO
CREATININE, UR: 123.5 mg/dL
Microalb/Creat Ratio: 11.2 mg/g creat (ref 0.0–30.0)
Microalbumin, Urine: 13.8 ug/mL

## 2017-02-24 LAB — IRON AND TIBC
IRON: 44 ug/dL (ref 27–159)
Iron Saturation: 18 % (ref 15–55)
Total Iron Binding Capacity: 241 ug/dL — ABNORMAL LOW (ref 250–450)
UIBC: 197 ug/dL (ref 131–425)

## 2017-03-02 ENCOUNTER — Other Ambulatory Visit: Payer: Self-pay | Admitting: Licensed Clinical Social Worker

## 2017-03-09 ENCOUNTER — Other Ambulatory Visit (INDEPENDENT_AMBULATORY_CARE_PROVIDER_SITE_OTHER): Payer: Self-pay

## 2017-03-09 ENCOUNTER — Other Ambulatory Visit: Payer: Self-pay

## 2017-03-09 DIAGNOSIS — Z1211 Encounter for screening for malignant neoplasm of colon: Secondary | ICD-10-CM

## 2017-03-09 LAB — POC HEMOCCULT BLD/STL (HOME/3-CARD/SCREEN)
FECAL OCCULT BLD: NEGATIVE
FECAL OCCULT BLD: NEGATIVE
Fecal Occult Blood, POC: NEGATIVE

## 2017-04-11 ENCOUNTER — Ambulatory Visit: Payer: Self-pay | Admitting: Internal Medicine

## 2017-05-18 ENCOUNTER — Ambulatory Visit: Payer: Self-pay | Admitting: Internal Medicine

## 2017-06-01 ENCOUNTER — Telehealth: Payer: Self-pay | Admitting: Internal Medicine

## 2017-06-01 ENCOUNTER — Other Ambulatory Visit: Payer: Self-pay | Admitting: Internal Medicine

## 2017-06-01 DIAGNOSIS — E1165 Type 2 diabetes mellitus with hyperglycemia: Secondary | ICD-10-CM

## 2017-06-09 ENCOUNTER — Telehealth: Payer: Self-pay | Admitting: Internal Medicine

## 2017-06-09 DIAGNOSIS — E669 Obesity, unspecified: Principal | ICD-10-CM

## 2017-06-09 DIAGNOSIS — E1169 Type 2 diabetes mellitus with other specified complication: Secondary | ICD-10-CM

## 2017-06-09 MED ORDER — GLUCOSE BLOOD VI STRP: ORAL_STRIP | 11 refills | Status: AC

## 2017-06-09 NOTE — Telephone Encounter (Signed)
Patient called stating needs a Rx on glucose blood test strip. Please advise.

## 2017-06-09 NOTE — Telephone Encounter (Signed)
[facility] for test strips [facility] to [facility]

## 2017-06-13 ENCOUNTER — Other Ambulatory Visit: Payer: Self-pay

## 2017-06-13 DIAGNOSIS — E1169 Type 2 diabetes mellitus with other specified complication: Secondary | ICD-10-CM

## 2017-06-13 DIAGNOSIS — E669 Obesity, unspecified: Principal | ICD-10-CM

## 2017-06-13 MED ORDER — GLIPIZIDE 5 MG PO TABS: ORAL_TABLET | ORAL | 11 refills | Status: DC

## 2017-06-13 NOTE — Telephone Encounter (Signed)
Patient needs Rx refill for strips 50 in a count. At [facility] Department. glipiZIDE (GLUCOTROL) 5 mg tablet at [facility]

## 2017-06-13 NOTE — Telephone Encounter (Signed)
Rx sent to pharmacy   

## 2017-06-27 ENCOUNTER — Telehealth: Payer: Self-pay | Admitting: Internal Medicine

## 2017-06-27 ENCOUNTER — Ambulatory Visit: Payer: Self-pay | Admitting: Internal Medicine

## 2017-06-27 ENCOUNTER — Other Ambulatory Visit: Payer: Self-pay

## 2017-06-27 MED ORDER — TERBINAFINE HCL 250 MG PO TABS: 250.0000 mg | ORAL_TABLET | Freq: Every day | ORAL | 0 refills | Status: DC

## 2017-06-27 NOTE — Telephone Encounter (Signed)
Left message to call back with more info.

## 2017-06-27 NOTE — Telephone Encounter (Signed)
Patient needs refill for toenail fungus spray.  Pat did not see Rx listing.

## 2017-06-27 NOTE — Telephone Encounter (Signed)
Spoke with patient. Rx for terbinafine sent [facility] and patient will stop by office and get goodRx coupon

## 2017-06-27 NOTE — Telephone Encounter (Signed)
Patient needs refill of spray for toenail fungus.  Pat did not see Rx listed.

## 2017-06-29 ENCOUNTER — Ambulatory Visit: Payer: Self-pay | Admitting: Internal Medicine

## 2017-08-05 ENCOUNTER — Other Ambulatory Visit: Payer: Self-pay

## 2017-08-05 MED ORDER — HYDROCHLOROTHIAZIDE 12.5 MG PO CAPS: 12.5000 mg | ORAL_CAPSULE | Freq: Every day | ORAL | 11 refills | Status: DC

## 2017-08-05 MED ORDER — AMLODIPINE BESYLATE 5 MG PO TABS: 5.0000 mg | ORAL_TABLET | Freq: Every day | ORAL | 11 refills | Status: DC

## 2017-08-17 ENCOUNTER — Other Ambulatory Visit: Payer: Self-pay | Admitting: Internal Medicine

## 2017-08-17 MED ORDER — HYDROCHLOROTHIAZIDE 12.5 MG PO CAPS: 12.5000 mg | ORAL_CAPSULE | Freq: Every day | ORAL | 7 refills | Status: DC

## 2017-08-17 NOTE — Telephone Encounter (Signed)
Patient informed Rx was sent to [facility] on [address]

## 2018-03-27 ENCOUNTER — Ambulatory Visit: Payer: Self-pay | Admitting: Internal Medicine

## 2018-03-27 ENCOUNTER — Encounter: Payer: Self-pay | Admitting: Internal Medicine

## 2018-03-27 VITALS — BP 150/80 | HR 74 | Temp 98.3°F | Resp 12 | Ht 63.0 in | Wt 219.0 lb

## 2018-03-27 DIAGNOSIS — J019 Acute sinusitis, unspecified: Secondary | ICD-10-CM

## 2018-03-27 DIAGNOSIS — E1169 Type 2 diabetes mellitus with other specified complication: Secondary | ICD-10-CM

## 2018-03-27 DIAGNOSIS — F410 Panic disorder [episodic paroxysmal anxiety] without agoraphobia: Secondary | ICD-10-CM

## 2018-03-27 DIAGNOSIS — E785 Hyperlipidemia, unspecified: Secondary | ICD-10-CM

## 2018-03-27 DIAGNOSIS — K029 Dental caries, unspecified: Secondary | ICD-10-CM

## 2018-03-27 DIAGNOSIS — I10 Essential (primary) hypertension: Secondary | ICD-10-CM

## 2018-03-27 DIAGNOSIS — B349 Viral infection, unspecified: Secondary | ICD-10-CM

## 2018-03-27 DIAGNOSIS — E669 Obesity, unspecified: Secondary | ICD-10-CM

## 2018-03-27 LAB — GLUCOSE, POCT (MANUAL RESULT ENTRY): POC GLUCOSE: 355 mg/dL — AB (ref 70–99)

## 2018-03-27 MED ORDER — SERTRALINE HCL 50 MG PO TABS
ORAL_TABLET | ORAL | 11 refills | Status: DC
Start: 2018-03-27 — End: 2019-07-24

## 2018-03-27 MED ORDER — TRIAMCINOLONE ACETONIDE 0.1 % EX CREA
1.0000 | TOPICAL_CREAM | Freq: Two times a day (BID) | CUTANEOUS | 1 refills | Status: DC
Start: 2018-03-27 — End: 2019-09-20

## 2018-03-27 MED ORDER — ATORVASTATIN CALCIUM 20 MG PO TABS: ORAL_TABLET | ORAL | 11 refills | Status: DC

## 2018-03-27 MED ORDER — AMLODIPINE BESYLATE 5 MG PO TABS: 5.0000 mg | ORAL_TABLET | Freq: Every day | ORAL | 11 refills | Status: DC

## 2018-03-27 MED ORDER — AZITHROMYCIN 250 MG PO TABS: ORAL_TABLET | ORAL | 0 refills | Status: DC

## 2018-03-27 NOTE — Progress Notes (Addendum)
   Subjective:    Patient ID: Erika Nielsen, female    DOB: 09-22-1960, 57 y.o.   MRN: [id number]  HPI   Here after 1 year hiatus.  Started getting hoarse with sore throat about 1.5 weeks ago.  Temp of 99.2 oral on Dec 3rd.  Developed cough productive of green to brown mucous.  Was asked to leave work last Tuesday, about 6 days ago.   She has nasal congestion, sinus congestion.   She is having diarrhea with this as well. 3 loose stools daily, no blood. Drinking water fine.  No vomiting.  Eating soft foods okay. Was feeling a bit dyspneic at times, but that is not a concern now. She has been using Equate Severe congestion and cough syrup with sugar alcohol.  Alka Seltzer plus dissolving tablets, Tyelenol and Ventolin inhaler, which she last used this morning and expired in 2016.  Sugars running 255-260.  Out of all prescription medication for 2 weeks.  She was not aware she had refills.  Up all night long urinating  She also has 4 meds she was filling at [facility] that need transfer to [facility] as she will be going on [employer].  Atorvastatin, Sertraline, Amlodipine, Triamcinolone cream.  Current Meds  Medication Sig  . albuterol (PROVENTIL HFA;VENTOLIN HFA) 108 (90 BASE) MCG/ACT inhaler Inhale 2 puffs into the lungs 4 (four) times daily.  Marland Kitchen aspirin 325 MG tablet Take 325 mg by mouth daily.  . blood glucose meter kit and supplies KIT Check blood sugars once to twice daily--especially before breakfast  . Calcium Carbonate-Vitamin D (CALCIUM + D PO) Take 1 tablet by mouth 2 (two) times daily.   . ferrous gluconate (FERGON) 324 MG tablet Take 1 tablet (324 mg total) by mouth daily with breakfast.  . glucose blood test strip Twice daily glucose testing  . ibuprofen (ADVIL,MOTRIN) 800 MG tablet Take 1 tablet (800 mg total) by mouth 3 (three) times daily. (Patient taking differently: Take 800 mg every 6 (six) hours as needed by mouth. )  . Psyllium (METAMUCIL MULTIHEALTH FIBER PO) Take 2 capsules by  mouth.  . TEA TREE OIL EX Apply topically as needed.   Allergies  Allergen Reactions  . Lisinopril Swelling   Review of Systems     Objective:   Physical Exam Hoarse, strong breath odor HEENT:  PERRL, EOMI TMs pearly gray, nasal mucosa swollen, red with green mucous.  NT over frontal and maxillary sinuses, throat without injection.  Significant plaqueing of teeth with gingival recession Neck:  Supple, no adenopathy Chest:  CTA CV:  RRR without murmur or rub.  Radial pulses normal and equal Abd:  S, NT, No HSM or mass, + BS       Assessment & Plan:  1.  Viral syndrome with secondary bacterial sinusitis:  To avoid cough syrups and switch to capsules or pills with her cold remedies.  To be careful with the amount of aspirin she is taking between her ASA and cold remedies as well Azithromycin 500 mg daily for 5 days for sinusitis.  2.  DM, hypertension, hypercholesterolemia, panic disorder, eczema:  She has refills on all her [facility] meds.  Sending meds from [facility] to [facility] as well. Not clear why she was unable to get affordable option with [place] through [facility] as we are credentialed with [facility].    3.  Dental decay:  Encouraged flossing nightly followed by brushing teeth.  To brush in morning as well

## 2018-04-21 ENCOUNTER — Ambulatory Visit: Payer: Self-pay | Admitting: Family Medicine

## 2018-07-20 ENCOUNTER — Other Ambulatory Visit: Payer: Self-pay | Admitting: Internal Medicine

## 2018-07-20 DIAGNOSIS — E1169 Type 2 diabetes mellitus with other specified complication: Secondary | ICD-10-CM

## 2018-07-20 DIAGNOSIS — E669 Obesity, unspecified: Principal | ICD-10-CM

## 2018-08-18 ENCOUNTER — Other Ambulatory Visit: Payer: Self-pay | Admitting: Internal Medicine

## 2018-08-18 DIAGNOSIS — E669 Obesity, unspecified: Secondary | ICD-10-CM

## 2018-08-18 DIAGNOSIS — E1169 Type 2 diabetes mellitus with other specified complication: Secondary | ICD-10-CM

## 2019-07-05 ENCOUNTER — Ambulatory Visit: Payer: Self-pay | Attending: Family

## 2019-07-05 DIAGNOSIS — Z23 Encounter for immunization: Secondary | ICD-10-CM

## 2019-07-05 NOTE — Progress Notes (Signed)
   Covid-19 Vaccination Clinic  Name:  Erika Nielsen    MRN: [id number] DOB: [birth year]  07/05/2019  [name] was observed post Covid-19 immunization for 15 minutes without incident. She was provided with Vaccine Information Sheet and instruction to access the V-Safe system.   [name] was instructed to call 911 with any severe reactions post vaccine: Marland Kitchen Difficulty breathing  . Swelling of face and throat  . A fast heartbeat  . A bad rash all over body  . Dizziness and weakness   Immunizations Administered    Name Date Dose VIS Date Route   Moderna COVID-19 Vaccine 07/05/2019  2:06 PM 0.5 mL 03/13/2019 Intramuscular   Manufacturer: Moderna   Lot: [id number]   [id number]

## 2019-07-24 ENCOUNTER — Other Ambulatory Visit: Payer: Self-pay

## 2019-07-24 ENCOUNTER — Encounter: Payer: Self-pay | Admitting: Nurse Practitioner

## 2019-07-24 ENCOUNTER — Ambulatory Visit (INDEPENDENT_AMBULATORY_CARE_PROVIDER_SITE_OTHER): Payer: 59 | Admitting: Nurse Practitioner

## 2019-07-24 VITALS — BP 120/82 | HR 80 | Temp 98.3°F | Ht 63.4 in | Wt 205.0 lb

## 2019-07-24 DIAGNOSIS — Z6835 Body mass index (BMI) 35.0-35.9, adult: Secondary | ICD-10-CM

## 2019-07-24 DIAGNOSIS — I10 Essential (primary) hypertension: Secondary | ICD-10-CM

## 2019-07-24 DIAGNOSIS — S91209A Unspecified open wound of unspecified toe(s) with damage to nail, initial encounter: Secondary | ICD-10-CM

## 2019-07-24 DIAGNOSIS — E785 Hyperlipidemia, unspecified: Secondary | ICD-10-CM

## 2019-07-24 DIAGNOSIS — F410 Panic disorder [episodic paroxysmal anxiety] without agoraphobia: Secondary | ICD-10-CM

## 2019-07-24 DIAGNOSIS — R1032 Left lower quadrant pain: Secondary | ICD-10-CM | POA: Diagnosis not present

## 2019-07-24 DIAGNOSIS — E669 Obesity, unspecified: Secondary | ICD-10-CM

## 2019-07-24 DIAGNOSIS — E1169 Type 2 diabetes mellitus with other specified complication: Secondary | ICD-10-CM

## 2019-07-24 DIAGNOSIS — Z1159 Encounter for screening for other viral diseases: Secondary | ICD-10-CM

## 2019-07-24 MED ORDER — GLIPIZIDE 5 MG PO TABS: ORAL_TABLET | ORAL | 1 refills | Status: DC

## 2019-07-24 MED ORDER — ATORVASTATIN CALCIUM 20 MG PO TABS: ORAL_TABLET | ORAL | 1 refills | Status: DC

## 2019-07-24 MED ORDER — HYDROCHLOROTHIAZIDE 12.5 MG PO CAPS: 12.5000 mg | ORAL_CAPSULE | Freq: Every day | ORAL | 1 refills | Status: DC

## 2019-07-24 MED ORDER — FARXIGA 10 MG PO TABS: 10.0000 mg | ORAL_TABLET | Freq: Every day | ORAL | 1 refills | Status: DC

## 2019-07-24 MED ORDER — SERTRALINE HCL 50 MG PO TABS: ORAL_TABLET | ORAL | 1 refills | Status: DC

## 2019-07-24 MED ORDER — CEPHALEXIN 500 MG PO CAPS: 500.0000 mg | ORAL_CAPSULE | Freq: Three times a day (TID) | ORAL | 0 refills | Status: DC

## 2019-07-24 MED ORDER — AMLODIPINE BESYLATE 5 MG PO TABS: 5.0000 mg | ORAL_TABLET | Freq: Every day | ORAL | 1 refills | Status: DC

## 2019-07-24 NOTE — Progress Notes (Signed)
This visit occurred during the SARS-CoV-2 public health emergency.  Safety protocols were in place, including screening questions prior to the visit, additional usage of staff PPE, and extensive cleaning of exam room while observing appropriate contact time as indicated for disinfecting solutions.  Subjective:     Patient ID: Erika Nielsen , female    DOB: 1960-08-22 , 59 y.o.   MRN: [id number]   Chief Complaint  Patient presents with  . Establish Care  . toenail concerns    she stated her toenail is split  . Abdominal Pain    patient stated she has been having some pain in her left ovary since last monday.     HPI  Here to establish care - she was referred by her friend Shary Key. She was going to [employer]. She was seen last year.  She is working part time - Retail banker. Single. Her daughter was killed in 1999 who was pregnant at the time.    PMH - diabetic (more than 10 years), hypertension, anxiety - sertraline - [place] - she has been recommended to see a psychiatrist but did not choose to.    [facility] - siblings - unsure of their health problems. Sister - knee problems.  Mother - hypertension. Father - unknown.    She has received her first covid vaccine - had a sore arm.  She is taking ibuprofen 4 - 800 mg tablets a day   No LMP recorded. Patient is postmenopausal.   Abdominal Pain This is a new problem. The current episode started 1 to 4 weeks ago (10 days ago). The onset quality is sudden. The problem occurs constantly. The problem has been gradually worsening. Pain location: left groin area  The quality of the pain is sharp. Pertinent negatives include no constipation, diarrhea, dysuria, fever, headaches (sometimes none now) or weight loss. Treatments tried: ibuprofen.  Diabetes She presents for her follow-up diabetic visit. She has type 2 diabetes mellitus. Her disease course has been stable. Pertinent negatives for hypoglycemia include no headaches (sometimes  none now). Pertinent negatives for diabetes include no weight loss. There are no hypoglycemic complications. There are no diabetic complications. Risk factors for coronary artery disease include obesity, sedentary lifestyle, post-menopausal and hypertension. Current diabetic treatment includes oral agent (dual therapy). She is following a generally unhealthy diet. When asked about meal planning, she reported none. (She checked her blood sugar this morning was 196) An ACE inhibitor/angiotensin II receptor blocker is being taken. She does not see a podiatrist.Eye exam is current.     Past Medical History:  Diagnosis Date  . Diabetes mellitus without complication ([place])   . Gall stones   . Hypertension   . Normocytic anemia 02/23/2017  . Panic attacks 02/23/2017     Family History  Problem Relation Age of Onset  . Hypertension Mother   . Cancer Maternal Grandfather      Current Outpatient Medications:  .  aspirin 325 MG tablet, Take 325 mg by mouth daily., Disp: , Rfl:  .  blood glucose meter kit and supplies KIT, Check blood sugars once to twice daily--especially before breakfast, Disp: 1 each, Rfl: 0 .  Calcium Carbonate-Vitamin D (CALCIUM + D PO), Take 1 tablet by mouth 2 (two) times daily. , Disp: , Rfl:  .  dapagliflozin propanediol (FARXIGA) 10 MG TABS tablet, Take 10 mg by mouth daily., Disp: , Rfl:  .  ferrous gluconate (FERGON) 324 MG tablet, Take 1 tablet (324 mg total) by  mouth daily with breakfast., Disp: , Rfl:  .  glucose blood test strip, Twice daily glucose testing, Disp: 100 each, Rfl: 11 .  ibuprofen (ADVIL,MOTRIN) 800 MG tablet, Take 1 tablet (800 mg total) by mouth 3 (three) times daily. (Patient taking differently: Take 800 mg every 6 (six) hours as needed by mouth. ), Disp: 21 tablet, Rfl: 0 .  methocarbamol (ROBAXIN) 500 MG tablet, Take 500 mg by mouth 2 (two) times daily., Disp: , Rfl:  .  sertraline (ZOLOFT) 50 MG tablet, 1 tab by mouth daily, Disp: 30 tablet, Rfl:  11 .  TEA TREE OIL EX, Apply topically as needed., Disp: , Rfl:  .  triamcinolone cream (KENALOG) 0.1 %, Apply 1 application topically 2 (two) times daily., Disp: 80 g, Rfl: 1 .  albuterol (PROVENTIL HFA;VENTOLIN HFA) 108 (90 BASE) MCG/ACT inhaler, Inhale 2 puffs into the lungs 4 (four) times daily. (Patient not taking: Reported on 07/24/2019), Disp: 1 Inhaler, Rfl: 0 .  amLODipine (NORVASC) 5 MG tablet, Take 1 tablet (5 mg total) by mouth daily. (Patient not taking: Reported on 07/24/2019), Disp: 30 tablet, Rfl: 11 .  atorvastatin (LIPITOR) 20 MG tablet, 1 tab by mouth with evening meal once daily (Patient not taking: Reported on 07/24/2019), Disp: 30 tablet, Rfl: 11 .  azithromycin (ZITHROMAX) 250 MG tablet, 2 tabs by mouth daily for 5 days (Patient not taking: Reported on 07/24/2019), Disp: 6 tablet, Rfl: 0 .  glipiZIDE (GLUCOTROL) 5 MG tablet, TAKE 1 TABLET BY MOUTH TWICE DAILY BEFORE MEAL(S) (Patient not taking: Reported on 07/24/2019), Disp: 60 tablet, Rfl: 0 .  hydrochlorothiazide (MICROZIDE) 12.5 MG capsule, Take 1 capsule (12.5 mg total) by mouth daily. In the morning (Patient not taking: Reported on 03/27/2018), Disp: 30 capsule, Rfl: 7 .  metFORMIN (GLUCOPHAGE-XR) 500 MG 24 hr tablet, TAKE 1 TABLET BY MOUTH ONCE DAILY WITH  BREAKFAST (Patient not taking: Reported on 03/27/2018), Disp: 60 tablet, Rfl: 11 .  Psyllium (METAMUCIL MULTIHEALTH FIBER PO), Take 2 capsules by mouth., Disp: , Rfl:    Allergies  Allergen Reactions  . Lisinopril Swelling     Review of Systems  Constitutional: Negative for fever and weight loss.  Gastrointestinal: Positive for abdominal pain. Negative for constipation and diarrhea.  Genitourinary: Negative for dysuria.  Neurological: Negative for headaches (sometimes none now).     Today's Vitals   07/24/19 1608  BP: 120/82  Pulse: 80  Temp: 98.3 F (36.8 C)  TempSrc: Oral  Weight: 205 lb (93 kg)  Height: 5' 3.4" (1.61 m)  PainSc: 0-No pain   Body mass  index is 35.86 kg/m.   Objective:  Physical Exam Vitals reviewed.  Constitutional:      Appearance: She is well-developed.  Abdominal:     General: Bowel sounds are normal.     Palpations: Abdomen is rigid.     Tenderness: There is abdominal tenderness in the left lower quadrant. There is no guarding.  Skin:    General: Skin is warm.     Capillary Refill: Capillary refill takes less than 2 seconds.  Neurological:     General: No focal deficit present.     Mental Status: She is alert and oriented to person, place, and time.     Cranial Nerves: No cranial nerve deficit.  Psychiatric:        Mood and Affect: Mood normal. Mood is not anxious.        Behavior: Behavior normal.         Assessment  And Plan:     1. Diabetes mellitus type 2 in obese (HCC)  Chronic, will check HgbA1c.   Continue with current medications  Encouraged to limit intake of sugary foods and drinks - glipiZIDE (GLUCOTROL) 5 MG tablet; TAKE 1 TABLET BY MOUTH TWICE DAILY BEFORE MEAL(S)  Dispense: 180 tablet; Refill: 1 - dapagliflozin propanediol (FARXIGA) 10 MG TABS tablet; Take 10 mg by mouth daily.  Dispense: 90 tablet; Refill: 1 - CBC - Hemoglobin A1c - CMP14+EGFR  2. Essential hypertension B/P is well controlled.  CMP ordered to check renal function.  The importance of regular exercise and dietary modification was stressed to the patient.  - hydrochlorothiazide (MICROZIDE) 12.5 MG capsule; Take 1 capsule (12.5 mg total) by mouth daily. In the morning  Dispense: 90 capsule; Refill: 1 - CBC - CMP14+EGFR  3. Hyperlipidemia, unspecified hyperlipidemia type  Chronic, will check her lipid panel - atorvastatin (LIPITOR) 20 MG tablet; 1 tab by mouth with evening meal once daily  Dispense: 90 tablet; Refill: 1 - amLODipine (NORVASC) 5 MG tablet; Take 1 tablet (5 mg total) by mouth daily.  Dispense: 90 tablet; Refill: 1 - CBC - Lipid panel  4. Panic attacks  Chronic, she has been on Sertraline refill  sent. - sertraline (ZOLOFT) 50 MG tablet; 1 tab by mouth daily  Dispense: 90 tablet; Refill: 1  5. Encounter for hepatitis C screening test for low risk patient  Will check for Hepatitis C screening due to being born between the years 47-1963 - Hepatitis C antibody  6. Nail avulsion of toe, initial encounter  Applied lidocaine to nailbed to remove top half of toenail. Cleansed with NS and applied neosporin, applied bandaid  I have also sent her to see a podiatrist and given her antibiotics due to her history of diabetes.   7. Left lower quadrant abdominal pain  Tenderness to left lower quadrant closer to ovaries   Will send for ultrasound - US Abdomen Complete; Future   Minette Brine, FNP    THE PATIENT IS ENCOURAGED TO PRACTICE SOCIAL DISTANCING DUE TO THE COVID-19 PANDEMIC.

## 2019-07-24 NOTE — Patient Instructions (Signed)
Soak left foot in warm water 2 times a day and dry and apply neosporin to left great toe.

## 2019-07-25 LAB — CMP14+EGFR
ALT: 18 IU/L (ref 0–32)
AST: 20 IU/L (ref 0–40)
Albumin/Globulin Ratio: 1.4 (ref 1.2–2.2)
Albumin: 4.6 g/dL (ref 3.8–4.9)
Alkaline Phosphatase: 91 IU/L (ref 39–117)
BUN/Creatinine Ratio: 19 (ref 9–23)
BUN: 16 mg/dL (ref 6–24)
Bilirubin Total: 0.2 mg/dL (ref 0.0–1.2)
CO2: 26 mmol/L (ref 20–29)
Calcium: 10.1 mg/dL (ref 8.7–10.2)
Chloride: 99 mmol/L (ref 96–106)
Creatinine, Ser: 0.85 mg/dL (ref 0.57–1.00)
GFR calc Af Amer: 88 mL/min/{1.73_m2} (ref >59)
GFR calc non Af Amer: 76 mL/min/{1.73_m2} (ref >59)
Globulin, Total: 3.2 g/dL (ref 1.5–4.5)
Glucose: 116 mg/dL — ABNORMAL HIGH (ref 65–99)
Potassium: 4 mmol/L (ref 3.5–5.2)
Sodium: 139 mmol/L (ref 134–144)
Total Protein: 7.8 g/dL (ref 6.0–8.5)

## 2019-07-25 LAB — HEPATITIS C ANTIBODY: Hep C Virus Ab: <0.1 s/co ratio (ref 0.0–0.9)

## 2019-07-25 LAB — HEMOGLOBIN A1C
Est. average glucose Bld gHb Est-mCnc: 189 mg/dL
Hgb A1c MFr Bld: 8.2 % — ABNORMAL HIGH (ref 4.8–5.6)

## 2019-07-25 LAB — LIPID PANEL
Chol/HDL Ratio: 4.9 ratio — ABNORMAL HIGH (ref 0.0–4.4)
Cholesterol, Total: 234 mg/dL — ABNORMAL HIGH (ref 100–199)
HDL: 48 mg/dL (ref >39)
LDL Chol Calc (NIH): 126 mg/dL — ABNORMAL HIGH (ref 0–99)
Triglycerides: 337 mg/dL — ABNORMAL HIGH (ref 0–149)
VLDL Cholesterol Cal: 60 mg/dL — ABNORMAL HIGH (ref 5–40)

## 2019-07-25 LAB — CBC
Hematocrit: 37.8 % (ref 34.0–46.6)
Hemoglobin: 12.2 g/dL (ref 11.1–15.9)
MCH: 27.7 pg (ref 26.6–33.0)
MCHC: 32.3 g/dL (ref 31.5–35.7)
MCV: 86 fL (ref 79–97)
Platelets: 292 10*3/uL (ref 150–450)
RBC: 4.41 x10E6/uL (ref 3.77–5.28)
RDW: 15.2 % (ref 11.7–15.4)
WBC: 6.6 10*3/uL (ref 3.4–10.8)

## 2019-07-25 NOTE — Addendum Note (Signed)
Addended by: Minette Brine F on: 07/25/2019 02:29 PM   Modules accepted: Orders

## 2019-07-27 LAB — SPECIMEN STATUS REPORT

## 2019-07-27 LAB — RHEUMATOID ARTHRITIS PROFILE
Cyclic Citrullin Peptide Ab: 3 units (ref 0–19)
Rheumatoid fact SerPl-aCnc: 11.1 IU/mL (ref 0.0–13.9)

## 2019-07-27 LAB — ANA: Anti Nuclear Antibody (ANA): NEGATIVE

## 2019-07-31 ENCOUNTER — Ambulatory Visit
Admission: RE | Admit: 2019-07-31 | Discharge: 2019-07-31 | Disposition: A | Discharge status: Home / Self Care | Payer: 59 | Source: Ambulatory Visit | Attending: Nurse Practitioner | Admitting: Nurse Practitioner

## 2019-07-31 ENCOUNTER — Telehealth: Payer: Self-pay

## 2019-07-31 ENCOUNTER — Other Ambulatory Visit: Payer: Self-pay

## 2019-07-31 DIAGNOSIS — R1032 Left lower quadrant pain: Secondary | ICD-10-CM

## 2019-07-31 DIAGNOSIS — D259 Leiomyoma of uterus, unspecified: Secondary | ICD-10-CM

## 2019-07-31 NOTE — Telephone Encounter (Signed)
Called patient and provided lab results to patient.  Blood levels are normal. Total cholesterol is up to 234 goal is less than 199, triglycerides are 337 goal is less than 149 this is found in bread/sweets decrease your intake of this type of foods. LDL is 126 goal is less than 99 limit intake of fried and fatty foods. HgbA1c is 8.2 goal is less than 5.6 are you taking the farxiga every day and the glipizide daily?   Patient states she is taking her [place] and Glipizide daily.

## 2019-08-07 ENCOUNTER — Ambulatory Visit: Payer: 59 | Attending: Family

## 2019-08-07 DIAGNOSIS — Z23 Encounter for immunization: Secondary | ICD-10-CM

## 2019-08-07 NOTE — Progress Notes (Signed)
   Covid-19 Vaccination Clinic  Name:  Erika Nielsen    MRN: [id number] DOB: 03/30/1961  08/07/2019  Erika Nielsen was observed post Covid-19 immunization for 15 minutes without incident. She was provided with Vaccine Information Sheet and instruction to access the V-Safe system.   Erika Nielsen was instructed to call 911 with any severe reactions post vaccine: Marland Kitchen Difficulty breathing  . Swelling of face and throat  . A fast heartbeat  . A bad rash all over body  . Dizziness and weakness   Immunizations Administered    Name Date Dose VIS Date Route   Moderna COVID-19 Vaccine 08/07/2019  1:54 PM 0.5 mL 03/2019 Intramuscular   Manufacturer: Moderna   Lot: [id number]   [id number]

## 2019-08-09 ENCOUNTER — Other Ambulatory Visit: Payer: Self-pay

## 2019-08-09 ENCOUNTER — Ambulatory Visit (INDEPENDENT_AMBULATORY_CARE_PROVIDER_SITE_OTHER): Payer: 59 | Admitting: Nurse Practitioner

## 2019-08-09 ENCOUNTER — Encounter: Payer: Self-pay | Admitting: Nurse Practitioner

## 2019-08-09 VITALS — BP 122/80 | HR 85 | Temp 98.6°F | Ht 62.6 in | Wt 207.0 lb

## 2019-08-09 DIAGNOSIS — L603 Nail dystrophy: Secondary | ICD-10-CM | POA: Diagnosis not present

## 2019-08-09 DIAGNOSIS — F419 Anxiety disorder, unspecified: Secondary | ICD-10-CM | POA: Diagnosis not present

## 2019-08-09 MED ORDER — MAGNESIUM 200 MG PO TABS: 1.0000 | ORAL_TABLET | Freq: Every day | ORAL | 2 refills | Status: DC

## 2019-08-09 NOTE — Progress Notes (Signed)
This visit occurred during the SARS-CoV-2 public health emergency.  Safety protocols were in place, including screening questions prior to the visit, additional usage of staff PPE, and extensive cleaning of exam room while observing appropriate contact time as indicated for disinfecting solutions.  Subjective:     Patient ID: Erika Nielsen , female    DOB: 1960/04/13 , 59 y.o.   MRN: [id number]   Chief Complaint  Patient presents with  . toenail split    HPI  Right great toe nail has come off a little. She used epsom salt and neosporin. She is scheduled to see the podiatrist in June.     Past Medical History:  Diagnosis Date  . Diabetes mellitus without complication ([place])   . Gall stones   . Hypertension   . Normocytic anemia 02/23/2017  . Panic attacks 02/23/2017     Family History  Problem Relation Age of Onset  . Hypertension Mother   . Cancer Maternal Grandfather      Current Outpatient Medications:  .  amLODipine (NORVASC) 5 MG tablet, Take 1 tablet (5 mg total) by mouth daily., Disp: 90 tablet, Rfl: 1 .  aspirin 325 MG tablet, Take 325 mg by mouth daily., Disp: , Rfl:  .  atorvastatin (LIPITOR) 20 MG tablet, 1 tab by mouth with evening meal once daily, Disp: 90 tablet, Rfl: 1 .  blood glucose meter kit and supplies KIT, Check blood sugars once to twice daily--especially before breakfast, Disp: 1 each, Rfl: 0 .  Calcium Carbonate-Vitamin D (CALCIUM + D PO), Take 1 tablet by mouth 2 (two) times daily. , Disp: , Rfl:  .  cephALEXin (KEFLEX) 500 MG capsule, Take 1 capsule (500 mg total) by mouth 3 (three) times daily., Disp: 30 capsule, Rfl: 0 .  dapagliflozin propanediol (FARXIGA) 10 MG TABS tablet, Take 10 mg by mouth daily., Disp: 90 tablet, Rfl: 1 .  ferrous gluconate (FERGON) 324 MG tablet, Take 1 tablet (324 mg total) by mouth daily with breakfast., Disp: , Rfl:  .  glipiZIDE (GLUCOTROL) 5 MG tablet, TAKE 1 TABLET BY MOUTH TWICE DAILY BEFORE MEAL(S), Disp: 180  tablet, Rfl: 1 .  glucose blood test strip, Twice daily glucose testing, Disp: 100 each, Rfl: 11 .  hydrochlorothiazide (MICROZIDE) 12.5 MG capsule, Take 1 capsule (12.5 mg total) by mouth daily. In the morning, Disp: 90 capsule, Rfl: 1 .  ibuprofen (ADVIL,MOTRIN) 800 MG tablet, Take 1 tablet (800 mg total) by mouth 3 (three) times daily. (Patient taking differently: Take 800 mg every 6 (six) hours as needed by mouth. ), Disp: 21 tablet, Rfl: 0 .  methocarbamol (ROBAXIN) 500 MG tablet, Take 500 mg by mouth 2 (two) times daily., Disp: , Rfl:  .  Psyllium (METAMUCIL MULTIHEALTH FIBER PO), Take 2 capsules by mouth., Disp: , Rfl:  .  sertraline (ZOLOFT) 50 MG tablet, 1 tab by mouth daily, Disp: 90 tablet, Rfl: 1 .  TEA TREE OIL EX, Apply topically as needed., Disp: , Rfl:  .  triamcinolone cream (KENALOG) 0.1 %, Apply 1 application topically 2 (two) times daily., Disp: 80 g, Rfl: 1   Allergies  Allergen Reactions  . Lisinopril Swelling     Review of Systems  Constitutional: Negative.   Respiratory: Negative.   Cardiovascular: Negative.  Negative for chest pain and leg swelling.  Neurological: Negative.  Negative for dizziness and headaches.  Psychiatric/Behavioral: Negative.      Today's Vitals   08/09/19 1539  BP: 122/80  Pulse: 85  Temp: 98.6 F (37 C)  TempSrc: Oral  SpO2: 96%  Weight: 207 lb (93.9 kg)  Height: 5' 2.6" (1.59 m)   Body mass index is 37.14 kg/m.   Objective:  Physical Exam Constitutional:      General: She is not in acute distress.    Appearance: Normal appearance.  Cardiovascular:     Rate and Rhythm: Normal rate and regular rhythm.     Pulses: Normal pulses.     Heart sounds: Normal heart sounds. No murmur.  Pulmonary:     Effort: Pulmonary effort is normal. No respiratory distress.     Breath sounds: Normal breath sounds.  Neurological:     General: No focal deficit present.     Mental Status: She is alert and oriented to person, place, and time.      Cranial Nerves: No cranial nerve deficit.  Psychiatric:        Mood and Affect: Mood normal.        Behavior: Behavior normal.        Thought Content: Thought content normal.        Judgment: Judgment normal.         Assessment And Plan:    1. Anxiety  She feels she has anxiety most times especially when driving or parking.   She is to take magnesium daily to see if this helps  Will refer to psychology for counseling - Magnesium 200 MG TABS; Take 1 tablet (200 mg total) by mouth daily.  Dispense: 30 tablet; Refill: 2 - Ambulatory referral to Psychology  2. Nail breaking  Nail has already broken off of left great toe,   Keep your appt with podiatry and stay well hydrated    Minette Brine, FNP    THE PATIENT IS ENCOURAGED TO PRACTICE SOCIAL DISTANCING DUE TO THE COVID-19 PANDEMIC.

## 2019-08-22 ENCOUNTER — Other Ambulatory Visit: Payer: Self-pay

## 2019-08-23 ENCOUNTER — Ambulatory Visit: Payer: 59 | Admitting: Nurse Practitioner

## 2019-08-23 ENCOUNTER — Encounter: Payer: Self-pay | Admitting: Nurse Practitioner

## 2019-08-23 VITALS — Ht 63.0 in | Wt 209.0 lb

## 2019-08-23 DIAGNOSIS — D251 Intramural leiomyoma of uterus: Secondary | ICD-10-CM | POA: Diagnosis not present

## 2019-08-23 DIAGNOSIS — N814 Uterovaginal prolapse, unspecified: Secondary | ICD-10-CM | POA: Diagnosis not present

## 2019-08-23 DIAGNOSIS — D252 Subserosal leiomyoma of uterus: Secondary | ICD-10-CM | POA: Diagnosis not present

## 2019-08-23 DIAGNOSIS — R1032 Left lower quadrant pain: Secondary | ICD-10-CM | POA: Diagnosis not present

## 2019-08-23 MED ORDER — IBUPROFEN 800 MG PO TABS: 800.0000 mg | ORAL_TABLET | Freq: Three times a day (TID) | ORAL | 1 refills | Status: DC | PRN

## 2019-08-23 NOTE — Patient Instructions (Signed)

## 2019-08-23 NOTE — Progress Notes (Signed)
   Erika Nielsen [birth year] [id number]   History:  59 y.o. MBF G1 P1 presents as referral for ultrasound findings. Was seen by PCP for left lower abdominal pain 07/24/2019, pelvic ultrasound was ordered.  Ultrasound findings: Small subserosal leiomyoma at posterior upper uterus -13 mm diameter, with remainder of exam unremarkable.  Postmenopausal -no HRT, no bleeding. Pain comes and goes, dull, has not tried any treatments. Denies constipation, nausea, vomiting, and diarrhea. Complains of urinary leakage and urgency at times, denies frequency and dysuria. History of diabetes, hypertension managed by PCP.   Gynecologic History No LMP recorded. Patient is postmenopausal.   Last Pap: 2013?Marland Kitchen Results were: normal per patient Last mammogram: 02/07/2014. Results were: normal Colonoscopy: never  Past medical history, past surgical history, family history and social history were all reviewed and documented in the EPIC chart.  ROS:  A ROS was performed and pertinent positives and negatives are included.  Exam:  Vitals:   08/23/19 1105  Weight: 209 lb (94.8 kg)  Height: 5\' 3"  (1.6 m)   Body mass index is 37.02 kg/m.  General appearance:  Normal Thyroid:  Symmetrical, normal in size, without palpable masses or nodularity. Respiratory  Auscultation:  Clear without wheezing or rhonchi Cardiovascular  Auscultation:  Regular rate, without rubs, murmurs or gallops  Edema/varicosities:  Not grossly evident Abdominal  Soft,nontender, without masses, guarding or rebound.  Liver/spleen:  No organomegaly noted  Hernia:  None appreciated  Skin  Inspection:  Grossly normal   Breasts: Examined lying and sitting.   Right: Without masses, retractions, discharge or axillary adenopathy.   Left: Without masses, retractions, discharge or axillary adenopathy. Gentitourinary   Inguinal/mons:  Normal without inguinal adenopathy  External genitalia:  Normal  BUS/Urethra/Skene's glands:  Normal  Vagina:   Urethrocele 3+  Cervix:  Normal  Uterus:  Normal in size, shape and contour.  Midline and mobile  Adnexa/parametria:     Rt: Without masses or tenderness.   Lt: Without masses or tenderness.  Anus and perineum: Normal    Assessment/Plan:  59 y.o. MBF G1P1 here as new patient for evaluation of left lower quadrant pain.   Left lower quadrant abdominal pain - Plan: ibuprofen (ADVIL) 800 MG tablet every 8 hours as needed for pain. Alternate with Tylenol. Will place GI referral  Intramural and subserous leiomyoma of uterus - discussed ultrasound findings and provided reassurance that pain is most likely not from fibroid due to size.   Prolapsed uterus - Stage 2+, this may be causing some pain due to pulling, especially with activity. Will need to follow up for evaluation and discussion of treatment options with Dr. Dellis Filbert.  Also needs to make annual appointment for full pelvic and breast exam with pap.       [name] [facility], 11:06 AM 08/23/2019

## 2019-08-30 ENCOUNTER — Encounter: Payer: Self-pay | Admitting: Obstetrics & Gynecology

## 2019-08-30 ENCOUNTER — Ambulatory Visit: Payer: 59 | Admitting: Obstetrics & Gynecology

## 2019-08-30 ENCOUNTER — Telehealth: Payer: Self-pay | Admitting: *Deleted

## 2019-08-30 ENCOUNTER — Other Ambulatory Visit: Payer: Self-pay

## 2019-08-30 VITALS — BP 124/76

## 2019-08-30 DIAGNOSIS — N393 Stress incontinence (female) (male): Secondary | ICD-10-CM | POA: Diagnosis not present

## 2019-08-30 DIAGNOSIS — N813 Complete uterovaginal prolapse: Secondary | ICD-10-CM | POA: Diagnosis not present

## 2019-08-30 DIAGNOSIS — R1032 Left lower quadrant pain: Secondary | ICD-10-CM | POA: Diagnosis not present

## 2019-08-30 DIAGNOSIS — E669 Obesity, unspecified: Secondary | ICD-10-CM

## 2019-08-30 DIAGNOSIS — E1169 Type 2 diabetes mellitus with other specified complication: Secondary | ICD-10-CM

## 2019-08-30 NOTE — Progress Notes (Signed)
    Erika Nielsen 1961/03/19 [id number]        59 y.o.  G1P1L1  Married  RP: Left lower abdominal pain intermittently/Prolapsed uterus  HPI:  Prolapsed uterus on exam by Marny Lowenstein NP on 08/23/2019.  Patient is not feeling a vaginal bulge or protrusion.  Active sexually without problem.  No pain when walking or sitting.  Her left lower abdominal pain is intermittent, happening mostly when laying down at night.  Patient feels relief when passing a BM.  Last time the pain with severe, patient thinks she had indigestion.  Mild SUI.  Not doing Kegels.  No UTI Sx.  Obesity.   OB History  No obstetric history on file.    Past medical history,surgical history, problem list, medications, allergies, family history and social history were all reviewed and documented in the EPIC chart.   Directed ROS with pertinent positives and negatives documented in the history of present illness/assessment and plan.  Exam:  Vitals:   08/30/19 1007  BP: 124/76   General appearance:  Normal  Abdomen: Normal  Gynecologic exam: Vulva normal except for cervix visible at the fourchette.  Bimanual exam laying down:  Uterus AV, normal volume, mobile, NT.  No adnexal mass, NT.  Standing with valsalva:  Cervix protruding slightly at the vulva about 1 cm.  No Cysto-Rectocele.  No pain during the exam.   Assessment/Plan:  59 y.o. No obstetric history on file.   1. Third degree uterine prolapse Diagnosis of Uterine prolapse discussed with patient.  Counseling on management reviewed including observation, pessary and eventually surgery.  Patient counseled on the importance of avoidling pelvic floor pressure by treating constipation, avoiding heavy lifting and avoiding pushing while lifting, treating cough, losing weight and emptying her bladder frequently before overfilling.  Decision to observe at this point as patient's pain is probably related to her bowel function.  2. SUI (stress urinary incontinence,  female) The importance of emptying her bladder frequently before overfilling reviewed.  Kegel exercises instructed.  Will refer to physical therapy as needed.  3. Left lower quadrant abdominal pain Left lower abdominal intermittent pain probably of gastrointestinal origin.  Refer to [facility] nutrition center to address this problem as well as weight loss and sugar control.  Princess Bruins MD, 10:37 AM 08/30/2019

## 2019-08-30 NOTE — Telephone Encounter (Signed)
Referral placed at [facility] , they will call to schedule.  

## 2019-08-30 NOTE — Telephone Encounter (Signed)
-----   Message from Princess Bruins, MD sent at 08/30/2019 10:39 AM EDT ----- Regarding: Refer to [place] [facility], obesity, GI pain with tendency for constipation.

## 2019-08-31 ENCOUNTER — Encounter: Payer: Self-pay | Admitting: Obstetrics & Gynecology

## 2019-08-31 NOTE — Patient Instructions (Signed)
1. Third degree uterine prolapse Diagnosis of Uterine prolapse discussed with patient.  Counseling on management reviewed including observation, pessary and eventually surgery.  Patient counseled on the importance of avoidling pelvic floor pressure by treating constipation, avoiding heavy lifting and avoiding pushing while lifting, treating cough, losing weight and emptying her bladder frequently before overfilling.  Decision to observe at this point as patient's pain is probably related to her bowel function.  2. SUI (stress urinary incontinence, female) The importance of emptying her bladder frequently before overfilling reviewed.  Kegel exercises instructed.  Will refer to physical therapy as needed.  3. Left lower quadrant abdominal pain Left lower abdominal intermittent pain probably of gastrointestinal origin.  Refer to [facility] nutrition center to address this problem as well as weight loss and sugar control.  Erika Nielsen, it was a pleasure seeing you today!

## 2019-09-04 NOTE — Telephone Encounter (Signed)
Per [facility] center note "Pt returned call and wants to hold off and speak with ins to make sure they will cover. Pt will call us back if she wants to schedule. "  This encounter will be closed.

## 2019-09-13 ENCOUNTER — Ambulatory Visit: Payer: 59 | Admitting: Psychologist

## 2019-09-17 ENCOUNTER — Other Ambulatory Visit: Payer: Self-pay

## 2019-09-20 ENCOUNTER — Other Ambulatory Visit: Payer: Self-pay

## 2019-09-20 ENCOUNTER — Encounter: Payer: Self-pay | Admitting: Nurse Practitioner

## 2019-09-20 ENCOUNTER — Ambulatory Visit (INDEPENDENT_AMBULATORY_CARE_PROVIDER_SITE_OTHER): Payer: 59 | Admitting: Nurse Practitioner

## 2019-09-20 VITALS — BP 126/80 | Ht 62.0 in | Wt 206.0 lb

## 2019-09-20 DIAGNOSIS — L309 Dermatitis, unspecified: Secondary | ICD-10-CM | POA: Diagnosis not present

## 2019-09-20 DIAGNOSIS — E785 Hyperlipidemia, unspecified: Secondary | ICD-10-CM

## 2019-09-20 DIAGNOSIS — Z01419 Encounter for gynecological examination (general) (routine) without abnormal findings: Secondary | ICD-10-CM

## 2019-09-20 DIAGNOSIS — N813 Complete uterovaginal prolapse: Secondary | ICD-10-CM

## 2019-09-20 MED ORDER — ATORVASTATIN CALCIUM 20 MG PO TABS: ORAL_TABLET | ORAL | 0 refills | Status: DC

## 2019-09-20 MED ORDER — TRIAMCINOLONE ACETONIDE 0.1 % EX CREA: 1.0000 "application " | TOPICAL_CREAM | Freq: Two times a day (BID) | CUTANEOUS | 1 refills | Status: DC

## 2019-09-20 NOTE — Patient Instructions (Addendum)
Schedule mammogram [facility] [phone] [address] [place], [place] [address]  Schedule Colonoscopy [facility] [phone] [place], [place] [address]    Pelvic Organ Prolapse Pelvic organ prolapse is the stretching, bulging, or dropping of pelvic organs into an abnormal position. It happens when the muscles and tissues that surround and support pelvic structures become weak or stretched. Pelvic organ prolapse can involve the:  Vagina (vaginal prolapse).  Uterus (uterine prolapse).  Bladder (cystocele).  Rectum (rectocele).  Intestines (enterocele). When organs other than the vagina are involved, they often bulge into the vagina or protrude from the vagina, depending on how severe the prolapse is. What are the causes? This condition may be caused by:  Pregnancy, labor, and childbirth.  Past pelvic surgery.  Decreased production of the hormone estrogen associated with menopause.  Consistently lifting more than 50 lb (23 kg).  Obesity.  Long-term inability to pass stool (chronic constipation).  A cough that lasts a long time (chronic).  Buildup of fluid in the abdomen due to certain diseases and other conditions. What are the signs or symptoms? Symptoms of this condition include:  Passing a little urine (loss of bladder control) when you cough, sneeze, strain, and exercise (stress incontinence). This may be worse immediately after childbirth. It may gradually improve over time.  Feeling pressure in your pelvis or vagina. This pressure may increase when you cough or when you are passing stool.  A bulge that protrudes from the opening of your vagina.  Difficulty passing urine or stool.  Pain in your lower back.  Pain, discomfort, or disinterest in sex.  Repeated bladder infections (urinary tract infections).  Difficulty inserting a tampon. In some people, this condition causes no symptoms. How is this  diagnosed? This condition may be diagnosed based on a vaginal and rectal exam. During the exam, you may be asked to cough and strain while you are lying down, sitting, and standing up. Your health care provider will determine if other tests are required, such as bladder function tests. How is this treated? Treatment for this condition may depend on your symptoms. Treatment may include:  Lifestyle changes, such as changes to your diet.  Emptying your bladder at scheduled times (bladder training therapy). This can help reduce or avoid urinary incontinence.  Estrogen. Estrogen may help mild prolapse by increasing the strength and tone of pelvic floor muscles.  Kegel exercises. These may help mild cases of prolapse by strengthening and tightening the muscles of the pelvic floor.  A soft, flexible device that helps support the vaginal walls and keep pelvic organs in place (pessary). This is inserted into your vagina by your health care provider.  Surgery. This is often the only form of treatment for severe prolapse. Follow these instructions at home:  Avoid drinking beverages that contain caffeine or alcohol.  Increase your intake of high-fiber foods. This can help decrease constipation and straining during bowel movements.  Lose weight if recommended by your health care provider.  Wear a sanitary pad or adult diapers if you have urinary incontinence.  Avoid heavy lifting and straining with exercise and work. Do not hold your breath when you perform mild to moderate lifting and exercise activities. Limit your activities as directed by your health care provider.  Do Kegel exercises as directed by your health care provider. To do this: ? Squeeze your pelvic floor muscles tight. You should feel a tight lift in your rectal area and  a tightness in your vaginal area. Keep your stomach, buttocks, and legs relaxed. ? Hold the muscles tight for up to 10 seconds. ? Relax your muscles. ? Repeat this  exercise 50 times a day, or as many times as told by your health care provider. Continue to do this exercise for at least 4-6 weeks, or for as long as told by your health care provider.  Take over-the-counter and prescription medicines only as told by your health care provider.  If you have a pessary, take care of it as told by your health care provider.  Keep all follow-up visits as told by your health care provider. This is important. Contact a health care provider if you:  Have symptoms that interfere with your daily activities or sex life.  Need medicine to help with the discomfort.  Notice bleeding from your vagina that is not related to your period.  Have a fever.  Have pain or bleeding when you urinate.  Have bleeding when you pass stool.  Pass urine when you have sex.  Have chronic constipation.  Have a pessary that falls out.  Have bad smelling vaginal discharge.  Have an unusual, low pain in your abdomen. Summary  Pelvic organ prolapse is the stretching, bulging, or dropping of pelvic organs into an abnormal position. It happens when the muscles and tissues that surround and support pelvic structures become weak or stretched.  When organs other than the vagina are involved, they often bulge into the vagina or protrude from the vagina, depending on how severe the prolapse is.  In most cases, this condition needs to be treated only if it produces symptoms. Treatment may include lifestyle changes, estrogen, Kegel exercises, pessary insertion, or surgery.  Avoid heavy lifting and straining with exercise and work. Do not hold your breath when you perform mild to moderate lifting and exercise activities. Limit your activities as directed by your health care provider. This information is not intended to replace advice given to you by your health care provider. Make sure you discuss any questions you have with your health care provider. Document Revised: 04/20/2017 Document  Reviewed: 04/20/2017 Elsevier Patient Education  2020 [place] Maintenance, Female Adopting a healthy lifestyle and getting preventive care are important in promoting health and wellness. Ask your health care provider about:  The right schedule for you to have regular tests and exams.  Things you can do on your own to prevent diseases and keep yourself healthy. What should I know about diet, weight, and exercise? Eat a healthy diet   Eat a diet that includes plenty of vegetables, fruits, low-fat dairy products, and lean protein.  Do not eat a lot of foods that are high in solid fats, added sugars, or sodium. Maintain a healthy weight Body mass index (BMI) is used to identify weight problems. It estimates body fat based on height and weight. Your health care provider can help determine your BMI and help you achieve or maintain a healthy weight. Get regular exercise Get regular exercise. This is one of the most important things you can do for your health. Most adults should:  Exercise for at least 150 minutes each week. The exercise should increase your heart rate and make you sweat (moderate-intensity exercise).  Do strengthening exercises at least twice a week. This is in addition to the moderate-intensity exercise.  Spend less time sitting. Even light physical activity can be beneficial. Watch cholesterol and blood lipids Have your blood tested for lipids and cholesterol  at 57 years of age, then have this test every 5 years. Have your cholesterol levels checked more often if:  Your lipid or cholesterol levels are high.  You are older than 57 years of age.  You are at high risk for heart disease. What should I know about cancer screening? Depending on your health history and family history, you may need to have cancer screening at various ages. This may include screening for:  Breast cancer.  Cervical cancer.  Colorectal cancer.  Skin cancer.  Lung  cancer. What should I know about heart disease, diabetes, and high blood pressure? Blood pressure and heart disease  High blood pressure causes heart disease and increases the risk of stroke. This is more likely to develop in people who have high blood pressure readings, are of African descent, or are overweight.  Have your blood pressure checked: ? Every 3-5 years if you are 23-80 years of age. ? Every year if you are 34 years old or older. Diabetes Have regular diabetes screenings. This checks your fasting blood sugar level. Have the screening done:  Once every three years after age 41 if you are at a normal weight and have a low risk for diabetes.  More often and at a younger age if you are overweight or have a high risk for diabetes. What should I know about preventing infection? Hepatitis B If you have a higher risk for hepatitis B, you should be screened for this virus. Talk with your health care provider to find out if you are at risk for hepatitis B infection. Hepatitis C Testing is recommended for:  Everyone born from [birth year] through 1963.  Anyone with known risk factors for hepatitis C. Sexually transmitted infections (STIs)  Get screened for STIs, including gonorrhea and chlamydia, if: ? You are sexually active and are younger than 57 years of age. ? You are older than 57 years of age and your health care provider tells you that you are at risk for this type of infection. ? Your sexual activity has changed since you were last screened, and you are at increased risk for chlamydia or gonorrhea. Ask your health care provider if you are at risk.  Ask your health care provider about whether you are at high risk for HIV. Your health care provider may recommend a prescription medicine to help prevent HIV infection. If you choose to take medicine to prevent HIV, you should first get tested for HIV. You should then be tested every 3 months for as long as you are taking the  medicine. Pregnancy  If you are about to stop having your period (premenopausal) and you may become pregnant, seek counseling before you get pregnant.  Take 400 to 800 micrograms (mcg) of folic acid every day if you become pregnant.  Ask for birth control (contraception) if you want to prevent pregnancy. Osteoporosis and menopause Osteoporosis is a disease in which the bones lose minerals and strength with aging. This can result in bone fractures. If you are 68 years old or older, or if you are at risk for osteoporosis and fractures, ask your health care provider if you should:  Be screened for bone loss.  Take a calcium or vitamin D supplement to lower your risk of fractures.  Be given hormone replacement therapy (HRT) to treat symptoms of menopause. Follow these instructions at home: Lifestyle  Do not use any products that contain nicotine or tobacco, such as cigarettes, e-cigarettes, and chewing tobacco. If you need help  quitting, ask your health care provider.  Do not use street drugs.  Do not share needles.  Ask your health care provider for help if you need support or information about quitting drugs. Alcohol use  Do not drink alcohol if: ? Your health care provider tells you not to drink. ? You are pregnant, may be pregnant, or are planning to become pregnant.  If you drink alcohol: ? Limit how much you use to 0-1 drink a day. ? Limit intake if you are breastfeeding.  Be aware of how much alcohol is in your drink. In the [place] one drink equals one 12 oz bottle of beer (355 mL), one 5 oz glass of wine (148 mL), or one 1 oz glass of hard liquor (44 mL). General instructions  Schedule regular health, dental, and eye exams.  Stay current with your vaccines.  Tell your health care provider if: ? You often feel depressed. ? You have ever been abused or do not feel safe at home. Summary  Adopting a healthy lifestyle and getting preventive care are important in  promoting health and wellness.  Follow your health care provider's instructions about healthy diet, exercising, and getting tested or screened for diseases.  Follow your health care provider's instructions on monitoring your cholesterol and blood pressure. This information is not intended to replace advice given to you by your health care provider. Make sure you discuss any questions you have with your health care provider. Document Revised: 03/22/2018 Document Reviewed: 03/22/2018 Elsevier Patient Education  2020 [employer].

## 2019-09-20 NOTE — Progress Notes (Signed)
   Erika Nielsen 1960/10/17 [id number]   History:  59 y.o. G1 P0 presents for annual exam without GYN complaints.  Postmenopausal -no HRT, no bleeding.  Saw PCP in April for left lower abdominal pain, ultrasound 07/31/2019 showed small fibroid 13 mm otherwise unremarkable, referral to [place] for evaluation.  Was seen by our office in May and was found to have third-degree uterine prolapse.  She was educated on diagnosis, management, and treatment options with the decision to observe at this time and that pain may be related to bowel function.  Hypertension, diabetes managed by PCP.  She is requesting a refill on her Lipitor and eczema cream today.  Sexually active with no problem.  Gynecologic History No LMP recorded. Patient is postmenopausal.  Last Pap:04/29/2011 . Results were: normal Last mammogram: 01/28/2014. Results were: normal Last colonoscopy:never  Past medical history, past surgical history, family history and social history were all reviewed and documented in the EPIC chart.  ROS:  A ROS was performed and pertinent positives and negatives are included.  Exam:  Vitals:   09/20/19 1131  BP: 126/80  Weight: 206 lb (93.4 kg)  Height: 5\' 2"  (1.575 m)   Body mass index is 37.68 kg/m.  General appearance:  Normal Thyroid:  Symmetrical, normal in size, without palpable masses or nodularity. Respiratory  Auscultation:  Clear without wheezing or rhonchi Cardiovascular  Auscultation:  Regular rate, without rubs, murmurs or gallops  Edema/varicosities:  Not grossly evident Abdominal  Soft,nontender, without masses, guarding or rebound.  Liver/spleen:  No organomegaly noted  Hernia:  None appreciated  Skin  Inspection:  Grossly normal   Breasts: Examined lying and sitting.   Right: Without masses, retractions, discharge or axillary adenopathy.   Left: Without masses, retractions, discharge or axillary adenopathy. Gentitourinary   Inguinal/mons:  Normal without inguinal  adenopathy  External genitalia:  Normal  BUS/Urethra/Skene's glands:  Normal  Vagina:  3+ uterine prolapse  Cervix:  Protruding slightly at vulva  Uterus:  Anteverted, normal in size, shape and contour.  Midline and mobile  Adnexa/parametria:     Rt: Without masses or tenderness.   Lt: Without masses or tenderness.  Anus and perineum: Normal  Digital rectal exam: Normal sphincter tone without palpated masses or tenderness  Assessment/Plan:  59 y.o. G2 P1 for annual exam.  Well female exam with routine gynecological exam - Education provided on SBEs, importance of preventative screenings, current guidelines, high calcium diet, regular exercise, and multivitamin daily. Pap with HPV typing today.   Screening for breast cancer - information provided on the breast center and encouraged to schedule mammogram soon  Screening for colon cancer - information provided on [facility] and encouraged to schedule colonoscopy soon  Hyperlipidemia, unspecified hyperlipidemia type - Plan: atorvastatin (LIPITOR) 20 MG tablet #30 sent to pharmacy with instructions to request further refills from PCP  Eczema, unspecified type - Plan: triamcinolone cream (KENALOG) 0.1 % sent to pharmacy with 1 refill. Has worsened with the warmer weather. Applying as needed with some relief.   Third degree uterine prolapse - again discussed management with Kegel exercises and avoiding lifting and pulling that may put pressure on pelvic floor muscles. She does not want a pessary or other treatments at this time. Also discussed keeping protruding cervix lubricated to prevent breakdown.   Follow up in 1 year for annual    [place], 12:46 PM 09/20/2019

## 2019-09-24 LAB — PAP, TP IMAGING W/ HPV RNA, RFLX HPV TYPE 16,18/45: HPV DNA High Risk: NOT DETECTED

## 2019-10-16 ENCOUNTER — Encounter: Payer: Self-pay | Admitting: Podiatry

## 2019-10-16 ENCOUNTER — Ambulatory Visit: Payer: 59 | Admitting: Podiatry

## 2019-10-16 ENCOUNTER — Other Ambulatory Visit: Payer: Self-pay

## 2019-10-16 VITALS — BP 137/71 | HR 76

## 2019-10-16 DIAGNOSIS — M79675 Pain in left toe(s): Secondary | ICD-10-CM | POA: Diagnosis not present

## 2019-10-16 DIAGNOSIS — E119 Type 2 diabetes mellitus without complications: Secondary | ICD-10-CM

## 2019-10-16 DIAGNOSIS — M2141 Flat foot [pes planus] (acquired), right foot: Secondary | ICD-10-CM | POA: Diagnosis not present

## 2019-10-16 DIAGNOSIS — B351 Tinea unguium: Secondary | ICD-10-CM | POA: Diagnosis not present

## 2019-10-16 DIAGNOSIS — Q828 Other specified congenital malformations of skin: Secondary | ICD-10-CM

## 2019-10-16 DIAGNOSIS — M2142 Flat foot [pes planus] (acquired), left foot: Secondary | ICD-10-CM

## 2019-10-16 DIAGNOSIS — M79674 Pain in right toe(s): Secondary | ICD-10-CM

## 2019-10-16 NOTE — Patient Instructions (Addendum)
Diabetes Mellitus and Foot Care Foot care is an important part of your health, especially when you have diabetes. Diabetes may cause you to have problems because of poor blood flow (circulation) to your feet and legs, which can cause your skin to:  Become thinner and drier.  Break more easily.  Heal more slowly.  Peel and crack. You may also have nerve damage (neuropathy) in your legs and feet, causing decreased feeling in them. This means that you may not notice minor injuries to your feet that could lead to more serious problems. Noticing and addressing any potential problems early is the best way to prevent future foot problems. How to care for your feet Foot hygiene  Wash your feet daily with warm water and mild soap. Do not use hot water. Then, pat your feet and the areas between your toes until they are completely dry. Do not soak your feet as this can dry your skin.  Trim your toenails straight across. Do not dig under them or around the cuticle. File the edges of your nails with an emery board or nail file.  Apply a moisturizing lotion or petroleum jelly to the skin on your feet and to dry, brittle toenails. Use lotion that does not contain alcohol and is unscented. Do not apply lotion between your toes. Shoes and socks  Wear clean socks or stockings every day. Make sure they are not too tight. Do not wear knee-high stockings since they may decrease blood flow to your legs.  Wear shoes that fit properly and have enough cushioning. Always look in your shoes before you put them on to be sure there are no objects inside.  To break in new shoes, wear them for just a few hours a day. This prevents injuries on your feet. Wounds, scrapes, corns, and calluses  Check your feet daily for blisters, cuts, bruises, sores, and redness. If you cannot see the bottom of your feet, use a mirror or ask someone for help.  Do not cut corns or calluses or try to remove them with medicine.  If you  find a minor scrape, cut, or break in the skin on your feet, keep it and the skin around it clean and dry. You may clean these areas with mild soap and water. Do not clean the area with peroxide, alcohol, or iodine.  If you have a wound, scrape, corn, or callus on your foot, look at it several times a day to make sure it is healing and not infected. Check for: ? Redness, swelling, or pain. ? Fluid or blood. ? Warmth. ? Pus or a bad smell. General instructions  Do not cross your legs. This may decrease blood flow to your feet.  Do not use heating pads or hot water bottles on your feet. They may burn your skin. If you have lost feeling in your feet or legs, you may not know this is happening until it is too late.  Protect your feet from hot and cold by wearing shoes, such as at the beach or on hot pavement.  Schedule a complete foot exam at least once a year (annually) or more often if you have foot problems. If you have foot problems, report any cuts, sores, or bruises to your health care provider immediately. Contact a health care provider if:  You have a medical condition that increases your risk of infection and you have any cuts, sores, or bruises on your feet.  You have an injury that is not   healing.  You have redness on your legs or feet.  You feel burning or tingling in your legs or feet.  You have pain or cramps in your legs and feet.  Your legs or feet are numb.  Your feet always feel cold.  You have pain around a toenail. Get help right away if:  You have a wound, scrape, corn, or callus on your foot and: ? You have pain, swelling, or redness that gets worse. ? You have fluid or blood coming from the wound, scrape, corn, or callus. ? Your wound, scrape, corn, or callus feels warm to the touch. ? You have pus or a bad smell coming from the wound, scrape, corn, or callus. ? You have a fever. ? You have a red line going up your leg. Summary  Check your feet every day  for cuts, sores, red spots, swelling, and blisters.  Moisturize feet and legs daily.  Wear shoes that fit properly and have enough cushioning.  If you have foot problems, report any cuts, sores, or bruises to your health care provider immediately.  Schedule a complete foot exam at least once a year (annually) or more often if you have foot problems. This information is not intended to replace advice given to you by your health care provider. Make sure you discuss any questions you have with your health care provider. Document Revised: 12/20/2018 Document Reviewed: 04/30/2016 Elsevier Patient Education  [place], Adult  Normally, a foot has a curve, called an arch, on its inner side. The arch creates a gap between the foot and the ground. Flat feet is a common condition in which one or both feet do not have an arch. What are the causes? This condition may be caused by:  Failure of a normal arch to develop during childhood.  An injury to tendons and ligaments in the foot, such as to the tendon that supports the arch (posterior tibial tendon).  Loose tendons or ligaments in the foot.  A wearing down of the arch over time.  Injury to bones in the foot.  An abnormality in the bones of the foot, called tarsal coalition. This happens when two or more bones in the foot are joined together (fused) before birth. What increases the risk? This condition is more likely to develop in:  Females.  Adults age 57 or older.  People who: ? Have a family history of flat feet. ? Have a history of childhood flexible flatfoot. ? Are obese. ? Have diabetes. ? Have high blood pressure. ? Participate in high-impact sports. ? Have inflammatory arthritis. ? Have a history of broken (fractured) or dislocated bones in the foot. What are the signs or symptoms? Symptoms of this condition include:  Pain or tightness along the bottom of the foot.  Foot pain that gets worse with  activity.  Swelling of the inner side of the foot.  Swelling of the ankle.  Pain on the outer side of the ankle.  Changes in the way that you walk (gait).  Pronation. This is when the foot and ankle lean inward when you are standing.  Bony bumps on the top or inner side of the foot. How is this diagnosed? This condition is diagnosed with a physical exam of your foot and ankle. Your health care provider may also:  Look at your shoes for patterns of wear on the soles.  Order imaging tests, such as X-rays, a CT scan, or an MRI.  Refer  you to a health care provider who specializes in feet (podiatrist) or a physical therapist. How is this treated? This condition may be treated with:  Stretching exercises or physical therapy. This helps to increase range of motion and relieve pain.  A shoe insert (orthotic). This helps to support the arch of your foot. Orthotics can be purchased from a store or can be custom-made by your health care provider.  Wearing shoes with appropriate arch support. This is especially important for athletes.  Medicines. These may be prescribed to relieve pain.  An ankle brace, boot, or cast. These may be used to relieve pressure on your foot. You may be given crutches if walking is painful.  Surgery. This may be done to improve the alignment of your foot. This is only needed if your posterior tibial tendon is torn or if you have tarsal coalition. Follow these instructions at home: Activity  Do any exercises as told by your health care provider.  If an activity causes pain, avoid it or try to find another activity that does not cause pain. General instructions  Wear orthotics and appropriate shoes as told by your health care provider.  Take over-the-counter and prescription medicines only as told by your health care provider.  Wear an ankle brace, boot, or cast as told by your health care provider.  Use crutches as told by your health care  provider.  Keep all follow-up visits as told by your health care provider. This is important. How is this prevented? To prevent the condition from getting worse:  Wear comfortable, supportive shoes that are appropriate for your activities.  Maintain a healthy weight.  Stay active in a way that your health care provider recommends. This will help to keep your feet flexible and strong.  Manage long-term (chronic) health conditions, such as diabetes, high blood pressure, and inflammatory arthritis.  Work with a health care provider if you have concerns about your feet or shoes. Contact a health care provider if:  You have pain in your foot or lower leg that gets worse or does not improve with medicine.  You have pain or difficulty when walking.  You have problems with your orthotics. Summary  Flat feet is a common condition in which one or both feet do not have a curve, called an arch, on the inner side.  Your health care provider may recommend a shoe insert (orthotic) or shoes with the appropriate arch support.  Other treatments may include stretching exercises or physical therapy, medicines to relieve pain, and wearing an ankle brace, boot, or cast.  Surgery may be done if you have a tear in the tendon that supports your arch (posterior tibial tendon) or if two or more of your foot bones were joined together (fused)  before birth (tarsal coalition). This information is not intended to replace advice given to you by your health care provider. Make sure you discuss any questions you have with your health care provider. Document Revised: 07/20/2018 Document Reviewed: 06/09/2016 Elsevier Patient Education  [place] are small areas of thickened skin that occur on the top, sides, or tip of a toe. They contain a cone-shaped core with a point that can press on a nerve below. This causes pain.  Calluses are areas of thickened skin that can occur  anywhere on the body, including the hands, fingers, palms, soles of the feet, and heels. Calluses are usually larger than corns. What are the causes? Corns and calluses  are caused by rubbing (friction) or pressure, such as from shoes that are too tight or do not fit properly. What increases the risk? Corns are more likely to develop in people who have misshapen toes (toe deformities), such as hammer toes. Calluses can occur with friction to any area of the skin. They are more likely to develop in people who:  Work with their hands.  Wear shoes that fit poorly, are too tight, or are high-heeled.  Have toe deformities. What are the signs or symptoms? Symptoms of a corn or callus include:  A hard growth on the skin.  Pain or tenderness under the skin.  Redness and swelling.  Increased discomfort while wearing tight-fitting shoes, if your feet are affected. If a corn or callus becomes infected, symptoms may include:  Redness and swelling that gets worse.  Pain.  Fluid, blood, or pus draining from the corn or callus. How is this diagnosed? Corns and calluses may be diagnosed based on your symptoms, your medical history, and a physical exam. How is this treated? Treatment for corns and calluses may include:  Removing the cause of the friction or pressure. This may involve: ? Changing your shoes. ? Wearing shoe inserts (orthotics) or other protective layers in your shoes, such as a corn pad. ? Wearing gloves.  Applying medicine to the skin (topical medicine) to help soften skin in the hardened, thickened areas.  Removing layers of dead skin with a file to reduce the size of the corn or callus.  Removing the corn or callus with a scalpel or laser.  Taking antibiotic medicines, if your corn or callus is infected.  Having surgery, if a toe deformity is the cause. Follow these instructions at home:   Take over-the-counter and prescription medicines only as told by your health  care provider.  If you were prescribed an antibiotic, take it as told by your health care provider. Do not stop taking it even if your condition starts to improve.  Wear shoes that fit well. Avoid wearing high-heeled shoes and shoes that are too tight or too loose.  Wear any padding, protective layers, gloves, or orthotics as told by your health care provider.  Soak your hands or feet and then use a file or pumice stone to soften your corn or callus. Do this as told by your health care provider.  Check your corn or callus every day for symptoms of infection. Contact a health care provider if you:  Notice that your symptoms do not improve with treatment.  Have redness or swelling that gets worse.  Notice that your corn or callus becomes painful.  Have fluid, blood, or pus coming from your corn or callus.  Have new symptoms. Summary  Corns are small areas of thickened skin that occur on the top, sides, or tip of a toe.  Calluses are areas of thickened skin that can occur anywhere on the body, including the hands, fingers, palms, and soles of the feet. Calluses are usually larger than corns.  Corns and calluses are caused by rubbing (friction) or pressure, such as from shoes that are too tight or do not fit properly.  Treatment may include wearing any padding, protective layers, gloves, or orthotics as told by your health care provider. This information is not intended to replace advice given to you by your health care provider. Make sure you discuss any questions you have with your health care provider. Document Revised: 07/19/2018 Document Reviewed: 02/09/2017 Elsevier Patient Education  2020 Elsevier  Inc.  

## 2019-10-19 NOTE — Progress Notes (Signed)
Subjective: Erika Nielsen presents today referred by Minette Brine, FNP for diabetic foot evaluation.  Patient relates 12 year history of diabetes.  Patient denies any history of foot wounds.  Patient denies any history of numbness, tingling, burning, pins/needles sensations.  Today, patient c/o of painful, discolored, thick toenails which interfere with daily activities.  Pain is aggravated when wearing enclosed shoe gear. She has attempted to trim nails, but is afraid due to her diabetes.  Also has longstanding h/o painful lesion medial plantar arch area of right foot. She has "dug it out" in the past years, but now is afraid because she is diabetic.  Past Medical History:  Diagnosis Date  . Diabetes mellitus without complication ([place])   . Gall stones   . Hypertension   . Normocytic anemia 02/23/2017  . Panic attacks 02/23/2017    Patient Active Problem List   Diagnosis Date Noted  . Prolapsed uterus 08/23/2019  . Intramural and subserous leiomyoma of uterus 08/23/2019  . Normocytic anemia 02/23/2017  . Panic attacks 02/23/2017  . Hyperlipidemia 12/24/2015  . Diabetes mellitus type 2 in obese ([place]) 07/31/2015  . PNEUMONIA, COMMUNITY ACQUIRED, PNEUMOCOCCAL 04/11/2009  . OBESITY 12/26/2008  . Essential hypertension 04/01/2008  . CEREBROVASCULAR DISEASE, ISCHEMIC 02/13/2008  . FACIAL PARESTHESIA, LEFT 02/07/2008  . ANEMIA, IRON DEFICIENCY 03/28/2007  . HEMORRHOIDS 12/22/2006  . Eczema 12/22/2006  . MUSCLE CRAMPS 12/22/2006  . Contact dermatitis and other eczema, due to unspecified cause 12/22/2006    Past Surgical History:  Procedure Laterality Date  . gallstone removal      Current Outpatient Medications on File Prior to Visit  Medication Sig Dispense Refill  . albuterol (VENTOLIN HFA) 108 (90 Base) MCG/ACT inhaler Inhale into the lungs.    Marland Kitchen amLODipine (NORVASC) 5 MG tablet Take 1 tablet (5 mg total) by mouth daily. 90 tablet 1  . aspirin 325 MG tablet Take 325 mg  by mouth daily.    Marland Kitchen atorvastatin (LIPITOR) 20 MG tablet 1 tab by mouth with evening meal once daily 30 tablet 0  . BIOTIN PO Take 1 tablet by mouth daily.    . blood glucose meter kit and supplies KIT Check blood sugars once to twice daily--especially before breakfast 1 each 0  . Calcium Carbonate-Vitamin D (CALCIUM + D PO) Take 1 tablet by mouth 2 (two) times daily.     . Calcium Carbonate-Vitamin D 600-200 MG-UNIT CAPS Take by mouth.    . dapagliflozin propanediol (FARXIGA) 10 MG TABS tablet Take 10 mg by mouth daily. 90 tablet 1  . ferrous gluconate (FERGON) 324 MG tablet Take 1 tablet (324 mg total) by mouth daily with breakfast.    . glipiZIDE (GLUCOTROL) 5 MG tablet TAKE 1 TABLET BY MOUTH TWICE DAILY BEFORE MEAL(S) 180 tablet 1  . glucose blood test strip Twice daily glucose testing 100 each 11  . hydrochlorothiazide (MICROZIDE) 12.5 MG capsule Take 1 capsule (12.5 mg total) by mouth daily. In the morning 90 capsule 1  . ibuprofen (ADVIL) 800 MG tablet Take 1 tablet (800 mg total) by mouth every 8 (eight) hours as needed. 30 tablet 1  . methocarbamol (ROBAXIN) 500 MG tablet Take 500 mg by mouth 2 (two) times daily.    . psyllium (METAMUCIL) 58.6 % packet Take by mouth.    . sertraline (ZOLOFT) 50 MG tablet 1 tab by mouth daily 90 tablet 1  . triamcinolone cream (KENALOG) 0.1 % Apply 1 application topically 2 (two) times daily. 80 g 1  No current facility-administered medications on file prior to visit.     Allergies  Allergen Reactions  . Lisinopril Swelling    Social History   Occupational History  . 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 Smoking status: Former Research scientist (life sciences)  . Smokeless tobacco: Never Used  Substance and Sexual Activity  . Alcohol use: Yes    Alcohol/week: 0.0 standard drinks    Comment: occasional  . Drug use: No  . Sexual activity: Not Currently    Partners: Male    Family History  Problem Relation Age of Onset  .  Hypertension Mother   . Cancer Maternal Grandfather     Immunization History  Administered Date(s) Administered  . Influenza Split 03/21/2015  . Influenza Whole 05/01/2009, 04/14/2010  . Influenza,inj,Quad PF,6+ Mos 03/09/2016  . Influenza-Unspecified 01/09/2017, 02/25/2018  . Moderna SARS-COVID-2 Vaccination 07/05/2019, 08/07/2019  . Pneumococcal Polysaccharide-23 05/01/2009  . Td 10/11/2002  . Tdap 02/23/2017    Review of systems: Positive Findings in bold print.  Constitutional:  chills, fatigue, fever, sweats, weight change Communication: Optometrist, sign Ecologist, hand writing, iPad/Android device Head: headaches, head injury Eyes: changes in vision, eye pain, glaucoma, cataracts, macular degeneration, diplopia, glare,  light sensitivity, eyeglasses or contacts, blindness Ears nose mouth throat: hearing impaired, hearing aids,  ringing in ears, deaf, sign language,  vertigo, nosebleeds,  rhinitis,  cold sores, snoring, swollen glands Cardiovascular: HTN, edema, arrhythmia, pacemaker in place, defibrillator in place, chest pain/tightness, chronic anticoagulation, blood clot, heart failure, MI Peripheral Vascular: leg cramps, varicose veins, blood clots, lymphedema, varicosities Respiratory:  difficulty breathing, denies congestion, SOB, wheezing, cough, emphysema Gastrointestinal: change in appetite or weight, abdominal pain, constipation, diarrhea, nausea, vomiting, vomiting blood, change in bowel habits, abdominal pain, jaundice, rectal bleeding, hemorrhoids, GERD Genitourinary:  nocturia,  pain on urination, polyuria,  blood in urine, Foley catheter, urinary urgency, ESRD on hemodialysis Musculoskeletal: amputation, cramping, stiff joints, painful joints, decreased joint motion, fractures, OA, gout, hemiplegia, paraplegia, uses cane, wheelchair bound, uses walker, uses rollator Skin: +changes in toenails, color change, dryness, itching, mole changes,  rash,  wound(s) Neurological: headaches, numbness in feet, paresthesias in feet, burning in feet, fainting,  seizures, change in speech,  headaches, memory problems/poor historian, cerebral palsy, weakness, paralysis, CVA, TIA Endocrine: diabetes, hypothyroidism, hyperthyroidism,  goiter, dry mouth, flushing, heat intolerance,  cold intolerance,  excessive thirst, denies polyuria,  nocturia Hematological:  easy bleeding, excessive bleeding, easy bruising, enlarged lymph nodes, on long term blood thinner, history of past transusions Allergy/immunological:  hives, eczema, frequent infections, multiple drug allergies, seasonal allergies, transplant recipient, multiple food allergies Psychiatric:  anxiety, depression, panic attacks, mood disorder, suicidal ideations, hallucinations, insomnia  Objective: Vitals:   10/16/19 1545  BP: 137/71  Pulse: 76    Erika Nielsen is a/an 59 y.o. female obese in NAD.Marland Kitchen AAO X 3.  Vascular Examination: Capillary refill time to digits immediate b/l. Palpable pedal pulses b/l LE. Pedal hair absent. Lower extremity skin temperature gradient within normal limits. No pain with calf compression b/l. No edema noted b/l lower extremities.  Dermatological Examination: Pedal skin with normal turgor, texture and tone bilaterally. No open wounds bilaterally. No interdigital macerations bilaterally. Toenails 1-5 b/l elongated, discolored, dystrophic, thickened, crumbly with subungual debris and tenderness to dorsal palpation. Porokeratotic lesion(s) right foot. No erythema, no edema, no drainage, no flocculence.  Musculoskeletal Examination: Normal muscle strength 5/5 to all lower extremity muscle groups bilaterally. No pain crepitus or joint limitation noted with ROM b/l. Pes  planus deformity noted b/l.  Patient ambulates independent of any assistive aids.  Footwear Assessment: Does the patient wear appropriate shoes? Yes. Does the patient need inserts/orthotics?  No.  Neurological Examination: Protective sensation intact 5/5 intact bilaterally with 10g monofilament b/l. Vibratory sensation intact b/l. Proprioception intact bilaterally. Deep tendon reflexes normal b/l.  Clonus negative b/l.  Hemoglobin A1C Latest Ref Rng & Units 07/24/2019  HGBA1C 4.8 - 5.6 % 8.2(H)  Some recent data might be hidden   Assessment: 1. Pain due to onychomycosis of toenails of both feet   2. Porokeratosis   3. Pes planus of both feet   4. Controlled type 2 diabetes mellitus without complication, without long-term current use of insulin ([place])   5. Encounter for diabetic foot exam ([place])    ADA Risk Categorization:  Low Risk:  Patient has all of the following: Intact protective sensation No prior foot ulcer  No severe deformity Pedal pulses present  Plan: -Examined patient. -Diabetic foot examination performed on today's visit. -Discussed diabetic foot care principles. Literature dispensed on today. -Toenails 1-5 b/l were debrided in length and girth with sterile nail nippers and dremel without iatrogenic bleeding.  -Painful porokeratotic lesion(s) right foot pared and enucleated with sterile scalpel blade with noted relief of pain symptoms after treatment. Light bleeding addressed with Lumicain Hemostatic Solution. Triple antibiotic ointment applied. Patient to apply triple antibiotic ointment to lesion once daily for one week. -Patient to report any pedal injuries to medical professional immediately. -Patient to continue soft, supportive shoe gear daily. -Patient/POA to call should there be question/concern in the interim.  Return in about 3 months (around 01/16/2020) for diabetic nail trim.

## 2019-10-23 ENCOUNTER — Ambulatory Visit (INDEPENDENT_AMBULATORY_CARE_PROVIDER_SITE_OTHER): Payer: 59 | Admitting: Nurse Practitioner

## 2019-10-23 ENCOUNTER — Encounter: Payer: Self-pay | Admitting: Nurse Practitioner

## 2019-10-23 ENCOUNTER — Other Ambulatory Visit: Payer: Self-pay

## 2019-10-23 VITALS — BP 124/72 | HR 84 | Temp 98.4°F | Ht 62.0 in | Wt 201.0 lb

## 2019-10-23 DIAGNOSIS — F419 Anxiety disorder, unspecified: Secondary | ICD-10-CM

## 2019-10-23 DIAGNOSIS — R82998 Other abnormal findings in urine: Secondary | ICD-10-CM

## 2019-10-23 DIAGNOSIS — Z1231 Encounter for screening mammogram for malignant neoplasm of breast: Secondary | ICD-10-CM

## 2019-10-23 DIAGNOSIS — E669 Obesity, unspecified: Secondary | ICD-10-CM

## 2019-10-23 DIAGNOSIS — Z1211 Encounter for screening for malignant neoplasm of colon: Secondary | ICD-10-CM

## 2019-10-23 DIAGNOSIS — E1169 Type 2 diabetes mellitus with other specified complication: Secondary | ICD-10-CM | POA: Diagnosis not present

## 2019-10-23 DIAGNOSIS — I10 Essential (primary) hypertension: Secondary | ICD-10-CM | POA: Diagnosis not present

## 2019-10-23 NOTE — Progress Notes (Signed)
This visit occurred during the SARS-CoV-2 public health emergency.  Safety protocols were in place, including screening questions prior to the visit, additional usage of staff PPE, and extensive cleaning of exam room while observing appropriate contact time as indicated for disinfecting solutions.  Subjective:     Patient ID: Erika Nielsen , female    DOB: 12-05-1960 , 59 y.o.   MRN: [id number]   Chief Complaint  Patient presents with  . Anxiety  . Hypertension    HPI  Wt Readings from Last 3 Encounters: 10/23/19 : 201 lb (91.2 kg) 09/20/19 : 206 lb (93.4 kg) 08/23/19 : 209 lb (94.8 kg)   Anxiety Presents for follow-up visit. Patient reports no chest pain, confusion, dizziness, nervous/anxious behavior or palpitations. Symptoms occur occasionally.    Hypertension This is a chronic problem. The current episode started more than 1 year ago. The problem is controlled. Associated symptoms include anxiety. Pertinent negatives include no chest pain or palpitations.  Diabetes She presents for her follow-up diabetic visit. She has type 2 diabetes mellitus. Pertinent negatives for hypoglycemia include no confusion, dizziness or nervousness/anxiousness. Pertinent negatives for diabetes include no chest pain, no fatigue, no polydipsia, no polyphagia and no polyuria. There are no hypoglycemic complications. There are no diabetic complications. Risk factors for coronary artery disease include diabetes mellitus, obesity and sedentary lifestyle. Current diabetic treatment includes diet and oral agent (monotherapy). She is compliant with treatment most of the time. She is following a generally unhealthy diet. When asked about meal planning, she reported none. She has not had a previous visit with a dietitian. She participates in exercise weekly. (Lowest has been 90 blood sugar) An ACE inhibitor/angiotensin II receptor blocker is being taken. She does not see a podiatrist.Eye exam is not current.      Past Medical History:  Diagnosis Date  . Diabetes mellitus without complication ([place])   . Gall stones   . Hypertension   . Normocytic anemia 02/23/2017  . Panic attacks 02/23/2017     Family History  Problem Relation Age of Onset  . Hypertension Mother   . Cancer Maternal Grandfather      Current Outpatient Medications:  .  albuterol (VENTOLIN HFA) 108 (90 Base) MCG/ACT inhaler, Inhale into the lungs., Disp: , Rfl:  .  amLODipine (NORVASC) 5 MG tablet, Take 1 tablet (5 mg total) by mouth daily., Disp: 90 tablet, Rfl: 1 .  aspirin 325 MG tablet, Take 325 mg by mouth daily., Disp: , Rfl:  .  atorvastatin (LIPITOR) 20 MG tablet, 1 tab by mouth with evening meal once daily, Disp: 30 tablet, Rfl: 0 .  BIOTIN PO, Take 1 tablet by mouth daily., Disp: , Rfl:  .  blood glucose meter kit and supplies KIT, Check blood sugars once to twice daily--especially before breakfast, Disp: 1 each, Rfl: 0 .  Calcium Carbonate-Vitamin D (CALCIUM + D PO), Take 1 tablet by mouth 2 (two) times daily. , Disp: , Rfl:  .  Calcium Carbonate-Vitamin D 600-200 MG-UNIT CAPS, Take by mouth., Disp: , Rfl:  .  ferrous gluconate (FERGON) 324 MG tablet, Take 1 tablet (324 mg total) by mouth daily with breakfast., Disp: , Rfl:  .  glipiZIDE (GLUCOTROL) 5 MG tablet, TAKE 1 TABLET BY MOUTH TWICE DAILY BEFORE MEAL(S), Disp: 180 tablet, Rfl: 1 .  glucose blood test strip, Twice daily glucose testing, Disp: 100 each, Rfl: 11 .  hydrochlorothiazide (MICROZIDE) 12.5 MG capsule, Take 1 capsule (12.5 mg total) by mouth daily. In  the morning, Disp: 90 capsule, Rfl: 1 .  ibuprofen (ADVIL) 800 MG tablet, Take 1 tablet (800 mg total) by mouth every 8 (eight) hours as needed., Disp: 30 tablet, Rfl: 1 .  sertraline (ZOLOFT) 50 MG tablet, 1 tab by mouth daily, Disp: 90 tablet, Rfl: 1 .  triamcinolone cream (KENALOG) 0.1 %, Apply 1 application topically 2 (two) times daily., Disp: 80 g, Rfl: 1 .  methocarbamol (ROBAXIN) 500 MG tablet,  Take 500 mg by mouth 2 (two) times daily. (Patient not taking: Reported on 10/23/2019), Disp: , Rfl:    Allergies  Allergen Reactions  . Lisinopril Swelling     Review of Systems  Constitutional: Negative.  Negative for fatigue.  Respiratory: Negative.   Cardiovascular: Negative for chest pain, palpitations and leg swelling.  Gastrointestinal: Negative.   Endocrine: Negative.  Negative for polydipsia, polyphagia and polyuria.  Skin: Negative.   Neurological: Negative.  Negative for dizziness.  Psychiatric/Behavioral: Negative for confusion. The patient is not nervous/anxious.      There were no vitals filed for this visit. There is no height or weight on file to calculate BMI.   Objective:  Physical Exam Vitals reviewed.  Constitutional:      General: She is not in acute distress.    Appearance: Normal appearance. She is well-developed. She is obese.  Cardiovascular:     Rate and Rhythm: Normal rate and regular rhythm.     Pulses: Normal pulses.     Heart sounds: Normal heart sounds. No murmur heard.   Pulmonary:     Effort: Pulmonary effort is normal. No respiratory distress.     Breath sounds: Normal breath sounds.  Chest:     Chest wall: No tenderness.  Musculoskeletal:        General: Normal range of motion.  Skin:    General: Skin is warm and dry.     Capillary Refill: Capillary refill takes less than 2 seconds.  Neurological:     General: No focal deficit present.     Mental Status: She is alert and oriented to person, place, and time.  Psychiatric:        Mood and Affect: Mood normal.        Behavior: Behavior normal.        Thought Content: Thought content normal.        Judgment: Judgment normal.         Assessment And Plan:     1. Anxiety  Chronic, this is much better controlled  2. Essential hypertension  Chronic, much better controlled - CMP14+EGFR - Lipid panel  3. Diabetes mellitus type 2 in obese (HCC)  Chronic,  Will check HgbA1c  she has lost approximately 5 lbs since her last office visit hopefully her levels have improved.    Will consider adding Rybelsus or Ozempic pending labs - CMP14+EGFR - Hemoglobin A1c - Ambulatory referral to Ophthalmology  4. Encounter for screening mammogram for breast cancer  Pt instructed on Self Breast Exam.According to ACOG guidelines Women aged 46 and older are recommended to get an annual mammogram. Form completed and given to patient contact the [facility] for appointment scheduing.   Pt encouraged to get annual mammogram - MM Digital Screening; Future  5. Encounter for screening colonoscopy  According to USPTF Colorectal cancer Screening guidelines. Colonoscopy is recommended every 10 years, starting at age 67years.  Will order cologuard for colon cancer screening if positive will refer to GI for colonoscopy, patient is aware.  -  Cologuard         Minette Brine, FNP   I, Minette Brine, FNP, have reviewed all documentation for this visit. The documentation on 10/23/19 for the exam, diagnosis, procedures, and orders are all accurate and complete.  THE PATIENT IS ENCOURAGED TO PRACTICE SOCIAL DISTANCING DUE TO THE COVID-19 PANDEMIC.

## 2019-10-24 LAB — CMP14+EGFR
ALT: 21 IU/L (ref 0–32)
AST: 17 IU/L (ref 0–40)
Albumin/Globulin Ratio: 1.3 (ref 1.2–2.2)
Albumin: 4.5 g/dL (ref 3.8–4.9)
Alkaline Phosphatase: 81 IU/L (ref 48–121)
BUN/Creatinine Ratio: 19 (ref 9–23)
BUN: 16 mg/dL (ref 6–24)
Bilirubin Total: 0.3 mg/dL (ref 0.0–1.2)
CO2: 25 mmol/L (ref 20–29)
Calcium: 10.4 mg/dL — ABNORMAL HIGH (ref 8.7–10.2)
Chloride: 97 mmol/L (ref 96–106)
Creatinine, Ser: 0.83 mg/dL (ref 0.57–1.00)
GFR calc Af Amer: 91 mL/min/{1.73_m2} (ref >59)
GFR calc non Af Amer: 79 mL/min/{1.73_m2} (ref >59)
Globulin, Total: 3.4 g/dL (ref 1.5–4.5)
Glucose: 112 mg/dL — ABNORMAL HIGH (ref 65–99)
Potassium: 3.5 mmol/L (ref 3.5–5.2)
Sodium: 139 mmol/L (ref 134–144)
Total Protein: 7.9 g/dL (ref 6.0–8.5)

## 2019-10-24 LAB — HEMOGLOBIN A1C
Est. average glucose Bld gHb Est-mCnc: 177 mg/dL
Hgb A1c MFr Bld: 7.8 % — ABNORMAL HIGH (ref 4.8–5.6)

## 2019-10-24 LAB — LIPID PANEL
Chol/HDL Ratio: 3.5 ratio (ref 0.0–4.4)
Cholesterol, Total: 144 mg/dL (ref 100–199)
HDL: 41 mg/dL (ref >39)
LDL Chol Calc (NIH): 60 mg/dL (ref 0–99)
Triglycerides: 269 mg/dL — ABNORMAL HIGH (ref 0–149)
VLDL Cholesterol Cal: 43 mg/dL — ABNORMAL HIGH (ref 5–40)

## 2019-10-25 LAB — URINE CULTURE

## 2019-11-01 LAB — EXTERNAL GENERIC LAB PROCEDURE

## 2019-11-01 LAB — COLOGUARD

## 2019-11-05 ENCOUNTER — Other Ambulatory Visit: Payer: Self-pay | Admitting: Nurse Practitioner

## 2019-11-05 DIAGNOSIS — R82998 Other abnormal findings in urine: Secondary | ICD-10-CM

## 2019-11-05 MED ORDER — AMOXICILLIN 875 MG PO TABS: 875.0000 mg | ORAL_TABLET | Freq: Two times a day (BID) | ORAL | 0 refills | Status: DC

## 2019-11-09 LAB — COLOGUARD: Cologuard: NEGATIVE

## 2019-11-14 ENCOUNTER — Encounter: Payer: Self-pay | Admitting: Nurse Practitioner

## 2019-11-14 LAB — COLOGUARD: COLOGUARD: NEGATIVE

## 2019-11-14 LAB — EXTERNAL GENERIC LAB PROCEDURE: COLOGUARD: NEGATIVE

## 2019-11-26 ENCOUNTER — Other Ambulatory Visit: Payer: Self-pay | Admitting: Nurse Practitioner

## 2019-11-26 DIAGNOSIS — E669 Obesity, unspecified: Secondary | ICD-10-CM

## 2019-11-26 DIAGNOSIS — E1169 Type 2 diabetes mellitus with other specified complication: Secondary | ICD-10-CM

## 2019-11-29 LAB — HM DIABETES EYE EXAM

## 2019-12-03 ENCOUNTER — Encounter: Payer: Self-pay | Admitting: Nurse Practitioner

## 2020-01-24 ENCOUNTER — Ambulatory Visit: Payer: 59 | Admitting: Nurse Practitioner

## 2020-01-28 ENCOUNTER — Other Ambulatory Visit: Payer: Self-pay

## 2020-01-28 ENCOUNTER — Ambulatory Visit: Payer: 59 | Admitting: Podiatry

## 2020-01-28 DIAGNOSIS — B351 Tinea unguium: Secondary | ICD-10-CM | POA: Diagnosis not present

## 2020-01-28 DIAGNOSIS — Q828 Other specified congenital malformations of skin: Secondary | ICD-10-CM | POA: Diagnosis not present

## 2020-01-28 DIAGNOSIS — E1169 Type 2 diabetes mellitus with other specified complication: Secondary | ICD-10-CM

## 2020-01-28 DIAGNOSIS — M79675 Pain in left toe(s): Secondary | ICD-10-CM | POA: Diagnosis not present

## 2020-01-28 DIAGNOSIS — M2142 Flat foot [pes planus] (acquired), left foot: Secondary | ICD-10-CM

## 2020-01-28 DIAGNOSIS — M79674 Pain in right toe(s): Secondary | ICD-10-CM | POA: Diagnosis not present

## 2020-01-28 DIAGNOSIS — E669 Obesity, unspecified: Secondary | ICD-10-CM

## 2020-01-28 DIAGNOSIS — M2141 Flat foot [pes planus] (acquired), right foot: Secondary | ICD-10-CM

## 2020-01-29 ENCOUNTER — Ambulatory Visit
Admission: RE | Admit: 2020-01-29 | Discharge: 2020-01-29 | Disposition: A | Discharge status: Home / Self Care | Payer: 59 | Source: Ambulatory Visit | Attending: Nurse Practitioner | Admitting: Nurse Practitioner

## 2020-01-29 DIAGNOSIS — Z1231 Encounter for screening mammogram for malignant neoplasm of breast: Secondary | ICD-10-CM

## 2020-01-30 ENCOUNTER — Other Ambulatory Visit: Payer: Self-pay | Admitting: Nurse Practitioner

## 2020-01-30 DIAGNOSIS — I10 Essential (primary) hypertension: Secondary | ICD-10-CM

## 2020-01-31 ENCOUNTER — Ambulatory Visit: Payer: 59 | Admitting: Nurse Practitioner

## 2020-02-01 ENCOUNTER — Other Ambulatory Visit: Payer: Self-pay | Admitting: Nurse Practitioner

## 2020-02-01 MED ORDER — METHOCARBAMOL 500 MG PO TABS: 500.0000 mg | ORAL_TABLET | Freq: Two times a day (BID) | ORAL | 0 refills | Status: DC | PRN

## 2020-02-01 MED ORDER — RYBELSUS 7 MG PO TABS: 1.0000 | ORAL_TABLET | Freq: Every day | ORAL | 2 refills | Status: DC

## 2020-02-01 NOTE — Progress Notes (Signed)
If she still wants to start Rybelsus she can come and pick up a sample and will send Rx for the 7 mg dose for next month.  Also muscle relaxer Rx sent

## 2020-02-03 ENCOUNTER — Encounter: Payer: Self-pay | Admitting: Podiatry

## 2020-02-03 NOTE — Progress Notes (Signed)
Subjective:  Patient ID: Erika Nielsen, female    DOB: [birth year],  MRN: [id number]  59 y.o. female presents with preventative diabetic foot care and painful porokeratotic lesion(s) right foot and painful mycotic toenails that limit ambulation. Painful toenails interfere with ambulation. Aggravating factors include wearing enclosed shoe gear. Pain is relieved with periodic professional debridement. Painful porokeratotic lesions are aggravated when weightbearing with and without shoegear. Pain is relieved with periodic professional debridement..    Patient's blood sugar was 131 mg/dl this morning. Her last Patient's A1c was 7.8%.  She voices no new pedal problems problems on today's visit.   PCP: Minette Brine, FNP and last visit was: 10/23/2019.  Review of Systems: Negative except as noted in the HPI.  Past Medical History:  Diagnosis Date  . Diabetes mellitus without complication ([place])   . Gall stones   . Hypertension   . Normocytic anemia 02/23/2017  . Panic attacks 02/23/2017   Past Surgical History:  Procedure Laterality Date  . gallstone removal     Patient Active Problem List   Diagnosis Date Noted  . Prolapsed uterus 08/23/2019  . Intramural and subserous leiomyoma of uterus 08/23/2019  . Normocytic anemia 02/23/2017  . Panic attacks 02/23/2017  . Hyperlipidemia 12/24/2015  . Diabetes mellitus type 2 in obese ([place]) 07/31/2015  . PNEUMONIA, COMMUNITY ACQUIRED, PNEUMOCOCCAL 04/11/2009  . OBESITY 12/26/2008  . Essential hypertension 04/01/2008  . CEREBROVASCULAR DISEASE, ISCHEMIC 02/13/2008  . FACIAL PARESTHESIA, LEFT 02/07/2008  . ANEMIA, IRON DEFICIENCY 03/28/2007  . HEMORRHOIDS 12/22/2006  . Eczema 12/22/2006  . MUSCLE CRAMPS 12/22/2006  . Contact dermatitis and other eczema, due to unspecified cause 12/22/2006    Current Outpatient Medications:  .  albuterol (VENTOLIN HFA) 108 (90 Base) MCG/ACT inhaler, Inhale into the lungs., Disp: , Rfl:  .  amLODipine  (NORVASC) 5 MG tablet, Take 1 tablet (5 mg total) by mouth daily., Disp: 90 tablet, Rfl: 1 .  amoxicillin (AMOXIL) 875 MG tablet, Take 1 tablet (875 mg total) by mouth 2 (two) times daily., Disp: 14 tablet, Rfl: 0 .  aspirin 325 MG tablet, Take 325 mg by mouth daily., Disp: , Rfl:  .  atorvastatin (LIPITOR) 20 MG tablet, 1 tab by mouth with evening meal once daily, Disp: 30 tablet, Rfl: 0 .  BIOTIN PO, Take 1 tablet by mouth daily., Disp: , Rfl:  .  blood glucose meter kit and supplies KIT, Check blood sugars once to twice daily--especially before breakfast, Disp: 1 each, Rfl: 0 .  Calcium Carbonate-Vitamin D (CALCIUM + D PO), Take 1 tablet by mouth 2 (two) times daily. , Disp: , Rfl:  .  Calcium Carbonate-Vitamin D 600-200 MG-UNIT CAPS, Take by mouth., Disp: , Rfl:  .  ferrous gluconate (FERGON) 324 MG tablet, Take 1 tablet (324 mg total) by mouth daily with breakfast., Disp: , Rfl:  .  glipiZIDE (GLUCOTROL) 5 MG tablet, TAKE 1 TABLET BY MOUTH TWICE DAILY BEFORE MEAL(S), Disp: 180 tablet, Rfl: 0 .  glucose blood test strip, Twice daily glucose testing, Disp: 100 each, Rfl: 11 .  hydrochlorothiazide (MICROZIDE) 12.5 MG capsule, Take 1 capsule by mouth once daily in the morning, Disp: 30 capsule, Rfl: 0 .  ibuprofen (ADVIL) 800 MG tablet, Take 1 tablet (800 mg total) by mouth every 8 (eight) hours as needed., Disp: 30 tablet, Rfl: 1 .  methocarbamol (ROBAXIN) 500 MG tablet, Take 1 tablet (500 mg total) by mouth 2 (two) times daily as needed for muscle spasms., Disp: 20 tablet,  Rfl: 0 .  Semaglutide (RYBELSUS) 7 MG TABS, Take 1 tablet by mouth daily. Take 30 minutes before breakfast, Disp: 30 tablet, Rfl: 2 .  sertraline (ZOLOFT) 50 MG tablet, 1 tab by mouth daily, Disp: 90 tablet, Rfl: 1 .  triamcinolone cream (KENALOG) 0.1 %, Apply 1 application topically 2 (two) times daily., Disp: 80 g, Rfl: 1 Allergies  Allergen Reactions  . Lisinopril Swelling   Social History   Tobacco Use  Smoking  Status Former Smoker  Smokeless Tobacco Never Used    Objective:  There were no vitals filed for this visit. Constitutional Patient is a pleasant 59 y.o. African American female in NAD. AAO x 3.  Vascular Capillary refill time to digits immediate b/l. Palpable DP pulse(s) b/l lower extremities Palpable PT pulse(s) b/l lower extremities Pedal hair absent. Lower extremity skin temperature gradient within normal limits. No pain with calf compression b/l. No edema noted b/l lower extremities. No cyanosis or clubbing noted.  Neurologic Normal speech. Protective sensation intact 5/5 intact bilaterally with 10g monofilament b/l. Vibratory sensation intact b/l.  Dermatologic Pedal skin with normal turgor, texture and tone bilaterally. No open wounds bilaterally. No interdigital macerations bilaterally. Toenails 1-5 b/l elongated, discolored, dystrophic, thickened, crumbly with subungual debris and tenderness to dorsal palpation. Porokeratotic lesion(s) right foot. No erythema, no edema, no drainage, no flocculence.  Orthopedic: Normal muscle strength 5/5 to all lower extremity muscle groups bilaterally. No pain crepitus or joint limitation noted with ROM b/l. Pes planus deformity noted b/l.  Patient ambulates independent of any assistive aids.   Hemoglobin A1C Latest Ref Rng & Units 10/23/2019 07/24/2019  HGBA1C 4.8 - 5.6 % 7.8(H) 8.2(H)  Some recent data might be hidden   Assessment:   1. Pain due to onychomycosis of toenails of both feet   2. Porokeratosis   3. Pes planus of both feet   4. Diabetes mellitus type 2 in obese [facility])    Plan:  Patient was evaluated and treated and all questions answered.  Onychomycosis with pain -Nails palliatively debridement as below. -Educated on self-care  Procedure: Nail Debridement Rationale: Pain Type of Debridement: manual, sharp debridement. Instrumentation: Nail nipper, rotary burr. Number of Nails: 10  -Examined patient. -No new findings. No new  orders. -Continue diabetic foot care principles. -Toenails 1-5 b/l were debrided in length and girth with sterile nail nippers and dremel without iatrogenic bleeding.  -Painful porokeratotic lesion(s) right foot pared and enucleated with sterile scalpel blade without incident. -Patient to report any pedal injuries to medical professional immediately. -Patient to continue soft, supportive shoe gear daily. -Patient/POA to call should there be question/concern in the interim.  Return in about 3 months (around 04/29/2020).  Marzetta Board, DPM

## 2020-03-13 ENCOUNTER — Other Ambulatory Visit: Payer: Self-pay | Admitting: Nurse Practitioner

## 2020-03-13 DIAGNOSIS — I10 Essential (primary) hypertension: Secondary | ICD-10-CM

## 2020-03-13 DIAGNOSIS — E785 Hyperlipidemia, unspecified: Secondary | ICD-10-CM

## 2020-05-05 ENCOUNTER — Ambulatory Visit: Payer: 59 | Admitting: Podiatry

## 2020-06-02 ENCOUNTER — Encounter: Payer: Self-pay | Admitting: Internal Medicine

## 2020-06-02 ENCOUNTER — Other Ambulatory Visit: Payer: Self-pay

## 2020-06-02 ENCOUNTER — Ambulatory Visit: Payer: 59 | Attending: Internal Medicine | Admitting: Internal Medicine

## 2020-06-02 VITALS — BP 160/85 | HR 68 | Resp 16 | Ht 64.0 in | Wt 198.0 lb

## 2020-06-02 DIAGNOSIS — E1169 Type 2 diabetes mellitus with other specified complication: Secondary | ICD-10-CM

## 2020-06-02 DIAGNOSIS — Z23 Encounter for immunization: Secondary | ICD-10-CM

## 2020-06-02 DIAGNOSIS — Z7689 Persons encountering health services in other specified circumstances: Secondary | ICD-10-CM

## 2020-06-02 DIAGNOSIS — D508 Other iron deficiency anemias: Secondary | ICD-10-CM

## 2020-06-02 DIAGNOSIS — I10 Essential (primary) hypertension: Secondary | ICD-10-CM

## 2020-06-02 DIAGNOSIS — E785 Hyperlipidemia, unspecified: Secondary | ICD-10-CM

## 2020-06-02 DIAGNOSIS — E669 Obesity, unspecified: Secondary | ICD-10-CM

## 2020-06-02 DIAGNOSIS — F411 Generalized anxiety disorder: Secondary | ICD-10-CM

## 2020-06-02 DIAGNOSIS — Z9229 Personal history of other drug therapy: Secondary | ICD-10-CM

## 2020-06-02 LAB — POCT GLYCOSYLATED HEMOGLOBIN (HGB A1C): HbA1c, POC (controlled diabetic range): 6.9 % (ref 0.0–7.0)

## 2020-06-02 LAB — GLUCOSE, POCT (MANUAL RESULT ENTRY): POC Glucose: 96 mg/dl (ref 70–99)

## 2020-06-02 MED ORDER — AMLODIPINE BESYLATE 5 MG PO TABS: 5.0000 mg | ORAL_TABLET | Freq: Every day | ORAL | 1 refills | Status: DC

## 2020-06-02 MED ORDER — RYBELSUS 7 MG PO TABS: 1.0000 | ORAL_TABLET | Freq: Every day | ORAL | 6 refills | Status: DC

## 2020-06-02 MED ORDER — HYDROCHLOROTHIAZIDE 12.5 MG PO CAPS: 12.5000 mg | ORAL_CAPSULE | Freq: Every morning | ORAL | 1 refills | Status: DC

## 2020-06-02 MED ORDER — GLIPIZIDE 5 MG PO TABS: 5.0000 mg | ORAL_TABLET | Freq: Two times a day (BID) | ORAL | 1 refills | Status: DC

## 2020-06-02 MED ORDER — ATORVASTATIN CALCIUM 20 MG PO TABS: ORAL_TABLET | ORAL | 6 refills | Status: DC

## 2020-06-02 NOTE — Patient Instructions (Addendum)
Diabetes Mellitus and Standards of Medical Care Living with and managing diabetes (diabetes mellitus) can be complicated. Your diabetes treatment may be managed by a team of health care providers, including:  A physician who specializes in diabetes (endocrinologist). You might also have visits with a nurse practitioner or physician assistant.  Nurses.  A registered dietitian.  A certified diabetes care and education specialist.  An exercise specialist.  A pharmacist.  An eye doctor.  A foot specialist (podiatrist).  A dental care provider.  A primary care provider.  A mental health care provider. How to manage your diabetes You can do many things to successfully manage your diabetes. Your health care providers will follow guidelines to help you get the best quality of care. Here are general guidelines for your diabetes management plan. Your health care providers may give you more specific instructions. Physical exams When you are diagnosed with diabetes, and each year after that, your health care provider will ask about your medical and family history. You will have a physical exam, which may include:  Measuring your height, weight, and body mass index (BMI).  Checking your blood pressure. This will be done at every routine medical visit. Your target blood pressure may vary depending on your medical conditions, your age, and other factors.  A thyroid exam.  A skin exam.  Screening for nerve damage (peripheral neuropathy). This may include checking the pulse in your legs and feet and the level of sensation in your hands and feet.  A foot exam to inspect the structure and skin of your feet, including checking for cuts, bruises, redness, blisters, sores, or other problems.  Screening for blood vessel (vascular) problems. This may include checking the pulse in your legs and feet and checking your temperature. Blood tests Depending on your treatment plan and your personal needs,  you may have the following tests:  Hemoglobin A1C (HbA1C). This test provides information about blood sugar (glucose) control over the previous 2-3 months. It is used to adjust your treatment plan, if needed. This test will be done: ? At least 2 times a year, if you are meeting your treatment goals. ? 4 times a year, if you are not meeting your treatment goals or if your goals have changed.  Lipid testing, including total cholesterol, LDL and HDL cholesterol, and triglyceride levels. ? The goal for LDL is less than 100 mg/dL (5.5 mmol/L). If you are at high risk for complications, the goal is less than 70 mg/dL (3.9 mmol/L). ? The goal for HDL is 40 mg/dL (2.2 mmol/L) or higher for men, and 50 mg/dL (2.8 mmol/L) or higher for women. An HDL cholesterol of 60 mg/dL (3.3 mmol/L) or higher gives some protection against heart disease. ? The goal for triglycerides is less than 150 mg/dL (8.3 mmol/L).  Liver function tests.  Kidney function tests.  Thyroid function tests.   Dental and eye exams  Visit your dentist two times a year.  If you have type 1 diabetes, your health care provider may recommend an eye exam within 5 years after you are diagnosed, and then once a year after your first exam. ? For children with type 1 diabetes, the health care provider may recommend an eye exam when your child is age 11 or older and has had diabetes for 3-5 years. After the first exam, your child should get an eye exam once a year.  If you have type 2 diabetes, your health care provider may recommend an eye exam as   soon as you are diagnosed, and then every 1-2 years after your first exam.   Immunizations  A yearly flu (influenza) vaccine is recommended annually for everyone 6 months or older. This is especially important if you have diabetes.  The pneumonia (pneumococcal) vaccine is recommended for everyone 2 years or older who has diabetes. If you are age 31 or older, you may get the pneumonia vaccine as a  series of two separate shots.  The hepatitis B vaccine is recommended for adults shortly after being diagnosed with diabetes. Adults and children with diabetes should receive all other vaccines according to age-specific recommendations from the Centers for Disease Control and Prevention (CDC). Mental and emotional health Screening for symptoms of eating disorders, anxiety, and depression is recommended at the time of diagnosis and after as needed. If your screening shows that you have symptoms, you may need more evaluation. You may work with a mental health care provider. Follow these instructions at home: Treatment plan You will monitor your blood glucose levels and may give yourself insulin. Your treatment plan will be reviewed at every medical visit. You and your health care provider will discuss:  How you are taking your medicines, including insulin.  Any side effects you have.  Your blood glucose level target goals.  How often you monitor your blood glucose level.  Lifestyle habits, such as activity level and tobacco, alcohol, and substance use. Education Your health care provider will assess how well you are monitoring your blood glucose levels and whether you are taking your insulin and medicines correctly. He or she may refer you to:  A certified diabetes care and education specialist to manage your diabetes throughout your life, starting at diagnosis.  A registered dietitian who can create and review your personal nutrition plan.  An exercise specialist who can discuss your activity level and exercise plan. General instructions  Take over-the-counter and prescription medicines only as told by your health care provider.  Keep all follow-up visits. This is important. Where to find support There are many diabetes support networks, including:  American Diabetes Association (ADA): diabetes.org  Defeat Diabetes Foundation: defeatdiabetes.org Where to find more  information  American Diabetes Association (ADA): www.diabetes.org  Association of Diabetes Care & Education Specialists (ADCES): diabeteseducator.org  International Diabetes Federation (IDF): https://www.munoz-bell.org/ Summary  Managing diabetes (diabetes mellitus) can be complicated. Your diabetes treatment may be managed by a team of health care providers.  Your health care providers follow guidelines to help you get the best quality care.  You should have physical exams, blood tests, blood pressure monitoring, immunizations, and screening tests regularly. Stay updated on how to manage your diabetes.  Your health care providers may also give you more specific instructions based on your individual health. This information is not intended to replace advice given to you by your health care provider. Make sure you discuss any questions you have with your health care provider. Document Revised: 10/04/2019 Document Reviewed: 10/04/2019 Elsevier Patient Education  [employer].   Influenza Virus Vaccine injection (Fluarix) What is this medicine? INFLUENZA VIRUS VACCINE (in floo EN zuh VAHY ruhs vak SEEN) helps to reduce the risk of getting influenza also known as the flu. This medicine may be used for other purposes; ask your health care provider or pharmacist if you have questions. COMMON BRAND NAME(S): Fluarix, Fluzone What should I tell my health care provider before I take this medicine? They need to know if you have any of these conditions:  bleeding disorder like  hemophilia  fever or infection  Guillain-Barre syndrome or other neurological problems  immune system problems  infection with the human immunodeficiency virus (HIV) or AIDS  low blood platelet counts  multiple sclerosis  an unusual or allergic reaction to influenza virus vaccine, eggs, chicken proteins, latex, gentamicin, other medicines, foods, dyes or preservatives  pregnant or trying to get  pregnant  breast-feeding How should I use this medicine? This vaccine is for injection into a muscle. It is given by a health care professional. A copy of Vaccine Information Statements will be given before each vaccination. Read this sheet carefully each time. The sheet may change frequently. Talk to your pediatrician regarding the use of this medicine in children. Special care may be needed. Overdosage: If you think you have taken too much of this medicine contact a poison control center or emergency room at once. NOTE: This medicine is only for you. Do not share this medicine with others. What if I miss a dose? This does not apply. What may interact with this medicine?  chemotherapy or radiation therapy  medicines that lower your immune system like etanercept, anakinra, infliximab, and adalimumab  medicines that treat or prevent blood clots like warfarin  phenytoin  steroid medicines like prednisone or cortisone  theophylline  vaccines This list may not describe all possible interactions. Give your health care provider a list of all the medicines, herbs, non-prescription drugs, or dietary supplements you use. Also tell them if you smoke, drink alcohol, or use illegal drugs. Some items may interact with your medicine. What should I watch for while using this medicine? Report any side effects that do not go away within 3 days to your doctor or health care professional. Call your health care provider if any unusual symptoms occur within 6 weeks of receiving this vaccine. You may still catch the flu, but the illness is not usually as bad. You cannot get the flu from the vaccine. The vaccine will not protect against colds or other illnesses that may cause fever. The vaccine is needed every year. What side effects may I notice from receiving this medicine? Side effects that you should report to your doctor or health care professional as soon as possible:  allergic reactions like skin  rash, itching or hives, swelling of the face, lips, or tongue Side effects that usually do not require medical attention (report to your doctor or health care professional if they continue or are bothersome):  fever  headache  muscle aches and pains  pain, tenderness, redness, or swelling at site where injected  weak or tired This list may not describe all possible side effects. Call your doctor for medical advice about side effects. You may report side effects to FDA at 1-800-FDA-1088. Where should I keep my medicine? This vaccine is only given in a clinic, pharmacy, doctor's office, or other health care setting and will not be stored at home. NOTE: This sheet is a summary. It may not cover all possible information. If you have questions about this medicine, talk to your doctor, pharmacist, or health care provider.  2021 Elsevier/Gold Standard (2007-10-25 09:30:40)
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
## 2020-06-03 LAB — IRON,TIBC AND FERRITIN PANEL
Ferritin: 372 ng/mL — ABNORMAL HIGH (ref 15–150)
Iron Saturation: 28 % (ref 15–55)
Iron: 66 ug/dL (ref 27–159)
Total Iron Binding Capacity: 235 ug/dL — ABNORMAL LOW (ref 250–450)
UIBC: 169 ug/dL (ref 131–425)

## 2020-06-03 LAB — CBC
Hematocrit: 36 % (ref 34.0–46.6)
Hemoglobin: 11.3 g/dL (ref 11.1–15.9)
MCH: 27.9 pg (ref 26.6–33.0)
MCHC: 31.4 g/dL — ABNORMAL LOW (ref 31.5–35.7)
MCV: 89 fL (ref 79–97)
Platelets: 277 10*3/uL (ref 150–450)
RBC: 4.05 x10E6/uL (ref 3.77–5.28)
RDW: 13.6 % (ref 11.7–15.4)
WBC: 8.5 10*3/uL (ref 3.4–10.8)

## 2020-06-03 LAB — MICROALBUMIN / CREATININE URINE RATIO
Creatinine, Urine: 52.7 mg/dL
Microalb/Creat Ratio: <6 mg/g creat (ref 0–29)
Microalbumin, Urine: <3 ug/mL

## 2020-06-05 ENCOUNTER — Telehealth: Payer: Self-pay

## 2020-06-05 NOTE — Telephone Encounter (Signed)
Contacted pt to go over lab results pt didn't answer lvm  

## 2020-06-30 ENCOUNTER — Encounter: Payer: Self-pay | Admitting: Pharmacist

## 2020-06-30 ENCOUNTER — Other Ambulatory Visit: Payer: Self-pay

## 2020-06-30 ENCOUNTER — Ambulatory Visit: Payer: 59 | Attending: Internal Medicine | Admitting: Pharmacist

## 2020-06-30 VITALS — BP 140/78

## 2020-06-30 DIAGNOSIS — I10 Essential (primary) hypertension: Secondary | ICD-10-CM

## 2020-06-30 NOTE — Progress Notes (Signed)
   S: PCP: Dr. Wynetta Emery      Patient arrives in good spirits. Presents to the clinic for hypertension evaluation, counseling, and management. Patient was referred and last seen by Primary Care Provider on 06/02/2020.  BP was elevated at that appt but pt had been without her HCTZ prior to that appointment.   Medication adherence reported. She has picked up the HCTZ and is taking it along with the amlodipine. She has taken both this morning.   Current BP Medications include:  Amlodipine 10 mg daily, HCTZ 12.5 mg daily   Dietary habits include: compliant with salt restriction; denies drinking excess caffeine  Exercise habits include: walks ~1-2x/week Family / Social history:  -FHx: HTN -Tobacco: former smoker  - Alcohol: endorses occasional use     O:  Vitals:   06/30/20 1432  BP: 140/78   Home BP readings: none  Last 3 Office BP readings: BP Readings from Last 3 Encounters:  06/30/20 140/78  06/02/20 (!) 160/85  10/23/19 124/72    BMET    Component Value Date/Time   NA 139 10/23/2019 1529   K 3.5 10/23/2019 1529   CL 97 10/23/2019 1529   CO2 25 10/23/2019 1529   GLUCOSE 112 (H) 10/23/2019 1529   GLUCOSE 241 (H) 04/03/2015 1821   BUN 16 10/23/2019 1529   CREATININE 0.83 10/23/2019 1529   CALCIUM 10.4 (H) 10/23/2019 1529   GFRNONAA 79 10/23/2019 1529   GFRAA 91 10/23/2019 1529    Renal function: CrCl cannot be calculated (Patient's most recent lab result is older than the maximum 21 days allowed.).  Clinical ASCVD: No  The 10-year ASCVD risk score Mikey Bussing DC Jr., et al., 2013) is: 16.5%   Values used to calculate the score:     Age: 60 years     Sex: Female     Is Non-Hispanic African American: Yes     Diabetic: Yes     Tobacco smoker: No     Systolic Blood Pressure: 384 mmHg     Is BP treated: Yes     HDL Cholesterol: 41 mg/dL     Total Cholesterol: 144 mg/dL   A/P: Hypertension longstanding currently above goal on current medications. BP Goal = < 130/80  mmHg. Medication adherence reported.  -Continued current medications for now. Will recheck in 1 month for reassessment. We can increase HCTZ to 25 mg daily at that time if needed.  -Counseled on lifestyle modifications for blood pressure control including reduced dietary sodium, increased exercise, adequate sleep.  Results reviewed and written information provided.   Total time in face-to-face counseling 15 minutes.   F/U Clinic Visit in 1 month with me.   Benard Halsted, PharmD, [place], [place] [phone]

## 2020-07-29 NOTE — Progress Notes (Signed)
   S:     PCP: Dr. Wynetta Emery PMH: HTN, T2DM, CVA      Patient arrives in good spirits. Presents to the clinic for hypertension evaluation, counseling, and management. Patient was referred and last seen by Primary Care Provider on 06/02/2020. Pt seen by pharmacy on 06/30/20. At that visit, BP was 140/78 and reported compliance with HCTZ and amlodipine. No medication changes were made.  Today, patient reports medication adherence and no missed doses with amlodipine 10 mg daily and HCTZ 12.5 mg daily in the mornings. Reports using a pill box to manage medications. Denies dizziness, headaches, blurry vision, and LE swelling. Reports having a BP monitor at home but needs batteries.   Medication adherence reported good .  Current BP Medications include: Amlodipine 10 mg daily (AM), HCTZ 12.5 mg daily (AM)  Previously tried BP medications: lisinopril (swelling)  Dietary habits include: compliant with salt restriction; denies drinking excess caffeine  Exercise habits include: walks ~1-2x/week Family / Social history:  -FHx: HTN -Tobacco: former smoker  - Alcohol: endorses occasional use     O:  Vitals:   07/30/20 1448  BP: (!) 151/89  Pulse: 69   Home BP readings: none  Last 3 Office BP readings: BP Readings from Last 3 Encounters:  06/30/20 140/78  06/02/20 (!) 160/85  10/23/19 124/72   BMET    Component Value Date/Time   NA 139 10/23/2019 1529   K 3.5 10/23/2019 1529   CL 97 10/23/2019 1529   CO2 25 10/23/2019 1529   GLUCOSE 112 (H) 10/23/2019 1529   GLUCOSE 241 (H) 04/03/2015 1821   BUN 16 10/23/2019 1529   CREATININE 0.83 10/23/2019 1529   CALCIUM 10.4 (H) 10/23/2019 1529   GFRNONAA 79 10/23/2019 1529   GFRAA 91 10/23/2019 1529   Renal function: CrCl cannot be calculated (Patient's most recent lab result is older than the maximum 21 days allowed.).  Clinical ASCVD: No  The 10-year ASCVD risk score Mikey Bussing DC Jr., et al., 2013) is: 16.5%   Values used to calculate the  score:     Age: 60 years     Sex: Female     Is Non-Hispanic African American: Yes     Diabetic: Yes     Tobacco smoker: No     Systolic Blood Pressure: 810 mmHg     Is BP treated: Yes     HDL Cholesterol: 41 mg/dL     Total Cholesterol: 144 mg/dL  A/P: Hypertension longstanding currently uncontolled on current medications. BP Goal = <130/80 mmHg. Medication adherence appears optimal. Encouraged patient to start checking BP at least 1 hour after medications 3-4 times weekly. Patient verbalized understanding.  -Increased HCTZ to 25 mg daily -Continued amlodipine 10 mg daily -F/u labs ordered - BMET today (last one was in July 2021) -Counseled on lifestyle modifications for blood pressure control including reduced dietary sodium, increased exercise, adequate sleep.  Results reviewed and written information provided.   Total time in face-to-face counseling 25 minutes.   F/U Clinic Visit in 6 weeks.   Lorel Monaco, PharmD, [place] PGY2 Ambulatory Care Resident [place]

## 2020-07-30 ENCOUNTER — Ambulatory Visit: Payer: 59 | Attending: Internal Medicine | Admitting: Pharmacist

## 2020-07-30 ENCOUNTER — Other Ambulatory Visit: Payer: Self-pay

## 2020-07-30 ENCOUNTER — Encounter: Payer: Self-pay | Admitting: Pharmacist

## 2020-07-30 VITALS — BP 151/89 | HR 69

## 2020-07-30 DIAGNOSIS — I10 Essential (primary) hypertension: Secondary | ICD-10-CM | POA: Diagnosis not present

## 2020-07-30 DIAGNOSIS — Z7189 Other specified counseling: Secondary | ICD-10-CM

## 2020-07-30 MED ORDER — HYDROCHLOROTHIAZIDE 25 MG PO TABS: 25.0000 mg | ORAL_TABLET | Freq: Every day | ORAL | 3 refills | Status: DC

## 2020-07-31 LAB — BASIC METABOLIC PANEL
BUN/Creatinine Ratio: 22 (ref 9–23)
BUN: 18 mg/dL (ref 6–24)
CO2: 29 mmol/L (ref 20–29)
Calcium: 10.2 mg/dL (ref 8.7–10.2)
Chloride: 96 mmol/L (ref 96–106)
Creatinine, Ser: 0.83 mg/dL (ref 0.57–1.00)
Glucose: 74 mg/dL (ref 65–99)
Potassium: 3.9 mmol/L (ref 3.5–5.2)
Sodium: 141 mmol/L (ref 134–144)
eGFR: 82 mL/min/{1.73_m2} (ref >59)

## 2020-08-01 ENCOUNTER — Telehealth: Payer: Self-pay

## 2020-08-01 ENCOUNTER — Other Ambulatory Visit: Payer: Self-pay | Admitting: Internal Medicine

## 2020-08-01 DIAGNOSIS — F410 Panic disorder [episodic paroxysmal anxiety] without agoraphobia: Secondary | ICD-10-CM

## 2020-08-01 MED ORDER — SERTRALINE HCL 50 MG PO TABS: ORAL_TABLET | ORAL | 1 refills | Status: DC

## 2020-08-01 NOTE — Telephone Encounter (Signed)
Contacted pt to go over lab results pt didn't left a detailed vm making pt aware labs are normal and if she has any questions or concerns to give [place] a call

## 2020-08-01 NOTE — Telephone Encounter (Signed)
Medication Refill - Medication: Generic Zoloft  50 mg  Has the patient contacted their pharmacy? No.  (Agent: If no, request that the patient contact the pharmacy for the refill.) (Agent: If yes, when and what did the pharmacy advise?)  Preferred Pharmacy (with phone number or street name): [place]  Agent: Please be advised that RX refills may take up to 3 business days. We ask that you follow-up with your pharmacy.

## 2020-08-01 NOTE — Telephone Encounter (Signed)
Requested medication (s) are due for refill today - yes  Requested medication (s) are on the active medication list -yes  Future visit scheduled -yes  Last refill: 07/24/19 #90 3 RF  Notes to clinic: Expired Rx- prescribed by outside provider  Requested Prescriptions  Pending Prescriptions Disp Refills   sertraline (ZOLOFT) 50 MG tablet 90 tablet 1    Sig: 1 tab by mouth daily      Psychiatry:  Antidepressants - SSRI Passed - 08/01/2020 10:57 AM      Passed - Valid encounter within last 6 months    Recent Outpatient Visits           2 days ago Essential hypertension   [place], Jarome Matin, RPH-CPP   1 month ago Essential hypertension   [place], Jarome Matin, RPH-CPP   2 months ago Establishing care with new doctor, encounter for   [place], MD       Future Appointments             In 1 month Daisy Blossom, Jarome Matin, [place]   In 1 month [name], NP [place]   In 2 months Ladell Pier, MD [place]                 Requested Prescriptions  Pending Prescriptions Disp Refills   sertraline (ZOLOFT) 50 MG tablet 90 tablet 1    Sig: 1 tab by mouth daily      Psychiatry:  Antidepressants - SSRI Passed - 08/01/2020 10:57 AM      Passed - Valid encounter within last 6 months    Recent Outpatient Visits           2 days ago Essential hypertension   [place], Jarome Matin, RPH-CPP   1 month ago Essential hypertension   [place], RPH-CPP   2 months ago Establishing care with new doctor, encounter for   [place], MD       Future Appointments             In 1 month Daisy Blossom, Jarome Matin, [place]   In 1 month [name], NP [place]   In 2 months Wynetta Emery, Dalbert Batman, MD [place]

## 2020-09-09 NOTE — Progress Notes (Addendum)
   S:     PCP: Dr. Wynetta Emery PMH: HTN, T2DM, CVA  Patient arrivesin good spirits. Presents to the clinic for hypertension evaluation, counseling, and management. Patient was referred and last seen by Primary Care Provider on2/21/2022. Pt seen by pharmacy on 07/30/2020. At that visit, BP was elevated at 151/89. HCTZ was increased from 12.5 mg to 25 mg daily.   Today, patient reports medication adherence with amlodipine 10 mg daily and HCTZ 25 mg daily. Denies dizziness, headaches, blurred vision, and LE swelling. Reports not checking BP at home, but did check today 178/69 with HR 77. Reports increasing physical activity - now walking 30 minutes every other day and drinking more water.  Medication adherence good.  Current BP Medications include:Amlodipine 10 mg daily (AM), HCTZ 25 mg daily(AM)  Previously tried BP medications: lisinopril (swelling)  Dietary habits include:compliant with salt restriction; denies drinking excess caffeine Exercise habits include:walking 30 minutes every other day Family / Social history: -FHx: HTN -Tobacco: former smoker  - Alcohol: endorses occasional use  O:   Home BP readings: 178/69  Last 3 Office BP readings: BP Readings from Last 3 Encounters:  09/10/20 (!) 145/84  07/30/20 (!) 151/89  06/30/20 140/78    BMET    Component Value Date/Time   NA 141 07/30/2020 1458   K 3.9 07/30/2020 1458   CL 96 07/30/2020 1458   CO2 29 07/30/2020 1458   GLUCOSE 74 07/30/2020 1458   GLUCOSE 241 (H) 04/03/2015 1821   BUN 18 07/30/2020 1458   CREATININE 0.83 07/30/2020 1458   CALCIUM 10.2 07/30/2020 1458   GFRNONAA 79 10/23/2019 1529   GFRAA 91 10/23/2019 1529    Renal function: CrCl cannot be calculated (Patient's most recent lab result is older than the maximum 21 days allowed.).  Clinical ASCVD: Yes  The 10-year ASCVD risk score Mikey Bussing DC Jr., et al., 2013) is: 18.3%   Values used to calculate the score:     Age: 60 years     Sex:  Female     Is Non-Hispanic African American: Yes     Diabetic: Yes     Tobacco smoker: No     Systolic Blood Pressure: 809 mmHg     Is BP treated: Yes     HDL Cholesterol: 41 mg/dL     Total Cholesterol: 144 mg/dL   A/P: Hypertension longstanding currently uncontrolled on current medications. BP Goal = <130/80 mmHg. Medication adherence appears optimal. Discussed importance of checking BP 3-4 times weekly at home to access BP control. Patient verbalized understanding. Most recent BMET 07/2020 wnl, therefore will initiate spironolactone. Encouraged patient to continue to exercise 30 minutes 3-4x/weekly. Patient verbalized understanding. -Initiated spironolactone 12.5 mg daily  -Continued amlodipine 10 mg daily -Continued HCTZ 25 mg daily -F/u Labs - Recommend checking BMET at next PCP visit  -Counseled on lifestyle modifications for blood pressure control including reduced dietary sodium, increased exercise, adequate sleep.  Results reviewed and written information provided.   Total time in face-to-face counseling 30 minutes.   F/U Clinic Visit in 3 weeks w/PCP.    Lorel Monaco, PharmD, [place] PGY2 Ambulatory Care Resident [place]

## 2020-09-10 ENCOUNTER — Ambulatory Visit: Payer: 59 | Attending: Internal Medicine

## 2020-09-10 ENCOUNTER — Encounter: Payer: Self-pay | Admitting: Pharmacist

## 2020-09-10 ENCOUNTER — Ambulatory Visit: Payer: 59 | Attending: Family Medicine | Admitting: Pharmacist

## 2020-09-10 ENCOUNTER — Other Ambulatory Visit: Payer: Self-pay

## 2020-09-10 VITALS — BP 145/84 | HR 73

## 2020-09-10 DIAGNOSIS — Z111 Encounter for screening for respiratory tuberculosis: Secondary | ICD-10-CM

## 2020-09-10 DIAGNOSIS — I10 Essential (primary) hypertension: Secondary | ICD-10-CM

## 2020-09-10 MED ORDER — SPIRONOLACTONE 25 MG PO TABS: 12.5000 mg | ORAL_TABLET | Freq: Every day | ORAL | 2 refills | Status: DC

## 2020-09-10 NOTE — Progress Notes (Signed)
Pt arrived at clinic for PPD testing. Ppd was placed in patient left arm. Pt to return on Friday for reading.

## 2020-09-12 LAB — TB SKIN TEST
Induration: 0 mm
TB Skin Test: NEGATIVE

## 2020-09-23 ENCOUNTER — Encounter: Payer: Self-pay | Admitting: Nurse Practitioner

## 2020-09-23 ENCOUNTER — Ambulatory Visit (INDEPENDENT_AMBULATORY_CARE_PROVIDER_SITE_OTHER): Payer: 59 | Admitting: Nurse Practitioner

## 2020-09-23 ENCOUNTER — Other Ambulatory Visit: Payer: Self-pay

## 2020-09-23 VITALS — BP 124/82 | Ht 63.5 in | Wt 198.0 lb

## 2020-09-23 DIAGNOSIS — M25552 Pain in left hip: Secondary | ICD-10-CM

## 2020-09-23 DIAGNOSIS — Z01419 Encounter for gynecological examination (general) (routine) without abnormal findings: Secondary | ICD-10-CM

## 2020-09-23 DIAGNOSIS — Z780 Asymptomatic menopausal state: Secondary | ICD-10-CM

## 2020-09-23 DIAGNOSIS — N813 Complete uterovaginal prolapse: Secondary | ICD-10-CM | POA: Diagnosis not present

## 2020-09-23 NOTE — Progress Notes (Signed)
Erika Nielsen [birth year] [id number]   History:  60 y.o. G1P0 presents for annual exam. Postmenopausal - no HRT, no bleeding. 3rd degree uterine prolapse. Complains of nighttime incontinence and occasionally has difficulty emptying bladder. Did not want intervention last year when first found. Normal pap and mammogram history. HTN, T2DM managed by PCP. Left hip pain that started about a week ago after lots of lifting/moving objects.   Gynecologic History No LMP recorded. Patient is postmenopausal.   Contraception/Family planning: post menopausal status  Health Maintenance Last Pap: 09/20/2019. Results were: Normal Last mammogram: 01/29/2020. Results were: Normal Last colonoscopy: Never. Negative Cologuard 2021 Last Dexa: Not indicated  Past medical history, past surgical history, family history and social history were all reviewed and documented in the EPIC chart.  ROS:  A ROS was performed and pertinent positives and negatives are included.  Exam:  Vitals:   09/23/20 1444  BP: 124/82  Weight: 198 lb (89.8 kg)  Height: 5' 3.5" (1.613 m)   Body mass index is 34.52 kg/m.  General appearance:  Normal Thyroid:  Symmetrical, normal in size, without palpable masses or nodularity. Respiratory  Auscultation:  Clear without wheezing or rhonchi Cardiovascular  Auscultation:  Regular rate, without rubs, murmurs or gallops  Edema/varicosities:  Not grossly evident Abdominal  Soft,nontender, without masses, guarding or rebound.  Liver/spleen:  No organomegaly noted  Hernia:  None appreciated  Skin  Inspection:  Grossly normal Breasts: Examined lying and sitting.   Right: Without masses, retractions, nipple discharge or axillary adenopathy.   Left: Without masses, retractions, nipple discharge or axillary adenopathy. Genitourinary   Inguinal/mons:  Normal without inguinal adenopathy  External genitalia:  Normal appearing vulva with no masses, tenderness, or  lesions  BUS/Urethra/Skene's glands:  Normal  Vagina:  Normal appearing with normal color and discharge, no lesions  Cervix:  Normal appearing without discharge or lesions  Uterus:   3+ uterine prolapse, normal volume, no tenderness, mobile  Adnexa/parametria:     Rt: Normal in size, without masses or tenderness.   Lt: Normal in size, without masses or tenderness.  Anus and perineum: Normal  Digital rectal exam: Normal sphincter tone without palpated masses or tenderness  Assessment/Plan:  60 y.o. G1P0 for annual exam.   Well female exam with routine gynecological exam -  Education provided on SBEs, importance of preventative screenings, current guidelines, high calcium diet, regular exercise, and multivitamin daily. Labs with PCP.   Third degree uterine prolapse - complains of nighttime incontinence and occasionally has difficulty emptying bladder. Did not want intervention last year when first found and does not want any today. If symptoms become bothersome she will return to discuss treatment options. Recommended pelvic floor PT but she is not interested at this time. Avoid pushing/pulling/twisting/lifting, avoid constipation, or anything that puts pressure on pelvic floor.   Postmenopausal - no HRT, no bleeding.   Left hip pain - x 1 week. Pain began after lots of lifting/moving. Reassured it is likely musculoskeletal. Recommend rest, ice/heat, Ibuprofen/tylenol. If symptoms do not begin to improve in 1-2 weeks it is recommended she follow up with PCP. She is aware it may take 4-6 weeks for full improvement.   Screening for cervical cancer - Normal Pap history.  Will repeat at 5-year interval per guidelines.  Screening for breast cancer - Normal mammogram history.  Continue annual screenings.  Normal breast exam today.  Screening for colon cancer - Has not had colonoscopy. Negative Cologuard in 2021.   Return in 1 year  for annual.    [name] DNP, 2:59 PM 09/23/2020

## 2020-09-30 ENCOUNTER — Other Ambulatory Visit: Payer: Self-pay

## 2020-09-30 ENCOUNTER — Ambulatory Visit: Payer: 59 | Attending: Internal Medicine | Admitting: Internal Medicine

## 2020-09-30 ENCOUNTER — Encounter: Payer: Self-pay | Admitting: Internal Medicine

## 2020-09-30 VITALS — BP 160/88 | HR 72 | Resp 16 | Wt 197.0 lb

## 2020-09-30 DIAGNOSIS — E785 Hyperlipidemia, unspecified: Secondary | ICD-10-CM

## 2020-09-30 DIAGNOSIS — R252 Cramp and spasm: Secondary | ICD-10-CM | POA: Diagnosis not present

## 2020-09-30 DIAGNOSIS — E1169 Type 2 diabetes mellitus with other specified complication: Secondary | ICD-10-CM

## 2020-09-30 DIAGNOSIS — L659 Nonscarring hair loss, unspecified: Secondary | ICD-10-CM | POA: Diagnosis not present

## 2020-09-30 DIAGNOSIS — E669 Obesity, unspecified: Secondary | ICD-10-CM | POA: Diagnosis not present

## 2020-09-30 DIAGNOSIS — I10 Essential (primary) hypertension: Secondary | ICD-10-CM | POA: Diagnosis not present

## 2020-09-30 LAB — POCT GLYCOSYLATED HEMOGLOBIN (HGB A1C): HbA1c, POC (controlled diabetic range): 7.8 % — AB (ref 0.0–7.0)

## 2020-09-30 LAB — GLUCOSE, POCT (MANUAL RESULT ENTRY): POC Glucose: 142 mg/dl — AB (ref 70–99)

## 2020-09-30 MED ORDER — METHOCARBAMOL 500 MG PO TABS: 500.0000 mg | ORAL_TABLET | Freq: Every day | ORAL | 1 refills | Status: DC | PRN

## 2020-09-30 MED ORDER — RYBELSUS 14 MG PO TABS: 14.0000 mg | ORAL_TABLET | Freq: Every day | ORAL | 5 refills | Status: DC

## 2020-09-30 MED ORDER — AMLODIPINE BESYLATE 10 MG PO TABS: 10.0000 mg | ORAL_TABLET | Freq: Every day | ORAL | 6 refills | Status: DC

## 2020-09-30 NOTE — Patient Instructions (Signed)
Your blood pressure is not at goal.  We have increased amlodipine from 5 mg daily to 10 mg daily.  Continue to monitor your blood pressure at home.  The goal is 130/80 or lower.  Your diabetes is not at goal.  Try to eat healthier snacks as discussed today.  We have increase the semaglutide from 7 mg daily to 14 mg daily.  Healthy Eating Following a healthy eating pattern may help you to achieve and maintain a healthy body weight, reduce the risk of chronic disease, and live a long and productive life. It is important to follow a healthy eating pattern at an appropriate calorie level for your body. Your nutritional needs should be metprimarily through food by choosing a variety of nutrient-rich foods. What are tips for following this plan? Reading food labels Read labels and choose the following: Reduced or low sodium. Juices with 100% fruit juice. Foods with low saturated fats and high polyunsaturated and monounsaturated fats. Foods with whole grains, such as whole wheat, cracked wheat, brown rice, and wild rice. Whole grains that are fortified with folic acid. This is recommended for women who are pregnant or who want to become pregnant. Read labels and avoid the following: Foods with a lot of added sugars. These include foods that contain brown sugar, corn sweetener, corn syrup, dextrose, fructose, glucose, high-fructose corn syrup, honey, invert sugar, lactose, malt syrup, maltose, molasses, raw sugar, sucrose, trehalose, or turbinado sugar. Do not eat more than the following amounts of added sugar per day: 6 teaspoons (25 g) for women. 9 teaspoons (38 g) for men. Foods that contain processed or refined starches and grains. Refined grain products, such as white flour, degermed cornmeal, white bread, and white rice. Shopping Choose nutrient-rich snacks, such as vegetables, whole fruits, and nuts. Avoid high-calorie and high-sugar snacks, such as potato chips, fruit snacks, and candy. Use  oil-based dressings and spreads on foods instead of solid fats such as butter, stick margarine, or cream cheese. Limit pre-made sauces, mixes, and "instant" products such as flavored rice, instant noodles, and ready-made pasta. Try more plant-protein sources, such as tofu, tempeh, black beans, edamame, lentils, nuts, and seeds. Explore eating plans such as the Mediterranean diet or vegetarian diet. Cooking Use oil to saut or stir-fry foods instead of solid fats such as butter, stick margarine, or lard. Try baking, boiling, grilling, or broiling instead of frying. Remove the fatty part of meats before cooking. Steam vegetables in water or broth. Meal planning  At meals, imagine dividing your plate into fourths: One-half of your plate is fruits and vegetables. One-fourth of your plate is whole grains. One-fourth of your plate is protein, especially lean meats, poultry, eggs, tofu, beans, or nuts. Include low-fat dairy as part of your daily diet.  Lifestyle Choose healthy options in all settings, including home, work, school, restaurants, or stores. Prepare your food safely: Wash your hands after handling raw meats. Keep food preparation surfaces clean by regularly washing with hot, soapy water. Keep raw meats separate from ready-to-eat foods, such as fruits and vegetables. Cook seafood, meat, poultry, and eggs to the recommended internal temperature. Store foods at safe temperatures. In general: Keep cold foods at 62F (4.4C) or below. Keep hot foods at 162F (60C) or above. Keep your freezer at [facility] (-17.8C) or below. Foods are no longer safe to eat when they have been between the temperatures of 40-162F (4.4-60C) for more than 2 hours. What foods should I eat? Fruits Aim to eat 2 cup-equivalents of  fresh, canned (in natural juice), or frozen fruits each day. Examples of 1 cup-equivalent of fruit include 1 small apple, 8large strawberries, 1 cup canned fruit,  cup dried fruit, or  1 cup 100% juice. Vegetables Aim to eat 2-3 cup-equivalents of fresh and frozen vegetables each day, including different varieties and colors. Examples of 1 cup-equivalent of vegetables include 2 medium carrots, 2 cups raw, leafy greens, 1 cup choppedvegetable (raw or cooked), or 1 medium baked potato. Grains Aim to eat 6 ounce-equivalents of whole grains each day. Examples of 1 ounce-equivalent of grains include 1 slice of bread, 1 cup ready-to-eat cereal,3 cups popcorn, or  cup cooked rice, pasta, or cereal. Meats and other proteins Aim to eat 5-6 ounce-equivalents of protein each day. Examples of 1 ounce-equivalent of protein include 1 egg, 1/2 cup nuts or seeds, or 1 tablespoon (16 g) peanut butter. A cut of meat or fish that is the size of a deck of cards is about 3-4 ounce-equivalents. Of the protein you eat each week, try to have at least 8 ounces come from seafood. This includes salmon, trout, herring, and anchovies. Dairy Aim to eat 3 cup-equivalents of fat-free or low-fat dairy each day. Examples of 1 cup-equivalent of dairy include 1 cup (240 mL) milk, 8 ounces (250 g) yogurt,1 ounces (44 g) natural cheese, or 1 cup (240 mL) fortified soy milk. Fats and oils Aim for about 5 teaspoons (21 g) per day. Choose monounsaturated fats, such as canola and olive oils, avocados, peanut butter, and most nuts, or polyunsaturated fats, such as sunflower, corn, and soybean oils, walnuts, pine nuts, sesame seeds, sunflower seeds, and flaxseed. Beverages Aim for six 8-oz glasses of water per day. Limit coffee to three to five 8-oz cups per day. Limit caffeinated beverages that have added calories, such as soda and energy drinks. Limit alcohol intake to no more than 1 drink a day for nonpregnant women and 2 drinks a day for men. One drink equals 12 oz of beer (355 mL), 5 oz of wine (148 mL), or 1 oz of hard liquor (44 mL). Seasoning and other foods Avoid adding excess amounts of salt to your foods. Try  flavoring foods with herbs and spices instead of salt. Avoid adding sugar to foods. Try using oil-based dressings, sauces, and spreads instead of solid fats. This information is based on general [place] nutrition guidelines. For more information, visit [employer]. Exact amounts may vary based on your nutrition needs. Summary A healthy eating plan may help you to maintain a healthy weight, reduce the risk of chronic diseases, and stay active throughout your life. Plan your meals. Make sure you eat the right portions of a variety of nutrient-rich foods. Try baking, boiling, grilling, or broiling instead of frying. Choose healthy options in all settings, including home, work, school, restaurants, or stores. This information is not intended to replace advice given to you by your health care provider. Make sure you discuss any questions you have with your healthcare provider. Document Revised: 07/11/2017 Document Reviewed: 07/11/2017 Elsevier Patient Education  [place].
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
## 2020-09-30 NOTE — Progress Notes (Signed)
Pt is requesting refill on Robaxin

## 2020-10-02 NOTE — Addendum Note (Signed)
Addended bySteffanie Dunn on: 10/02/2020 11:32 AM   Modules accepted: Orders

## 2020-10-03 LAB — BASIC METABOLIC PANEL
BUN/Creatinine Ratio: 20 (ref 9–23)
BUN: 21 mg/dL (ref 6–24)
CO2: 26 mmol/L (ref 20–29)
Calcium: 10.5 mg/dL — ABNORMAL HIGH (ref 8.7–10.2)
Chloride: 97 mmol/L (ref 96–106)
Creatinine, Ser: 1.06 mg/dL — ABNORMAL HIGH (ref 0.57–1.00)
Glucose: 222 mg/dL — ABNORMAL HIGH (ref 65–99)
Potassium: 4.2 mmol/L (ref 3.5–5.2)
Sodium: 139 mmol/L (ref 134–144)
eGFR: 61 mL/min/{1.73_m2} (ref >59)

## 2020-10-03 NOTE — Progress Notes (Signed)
Let patient know that her potassium level is good.  She has mild elevation in calcium level likely due to the hydrochlorothiazide.  We will observe for now.

## 2020-10-10 ENCOUNTER — Telehealth: Payer: Self-pay

## 2020-10-10 NOTE — Telephone Encounter (Signed)
Contacted pt to go over lab results pt is aware and doesn't have any questions or concerns 

## 2020-11-10 ENCOUNTER — Other Ambulatory Visit: Payer: Self-pay

## 2020-11-10 ENCOUNTER — Encounter: Payer: Self-pay | Admitting: Podiatry

## 2020-11-10 ENCOUNTER — Ambulatory Visit (INDEPENDENT_AMBULATORY_CARE_PROVIDER_SITE_OTHER): Payer: 59 | Admitting: Podiatry

## 2020-11-10 DIAGNOSIS — M79675 Pain in left toe(s): Secondary | ICD-10-CM

## 2020-11-10 DIAGNOSIS — E669 Obesity, unspecified: Secondary | ICD-10-CM

## 2020-11-10 DIAGNOSIS — M79674 Pain in right toe(s): Secondary | ICD-10-CM

## 2020-11-10 DIAGNOSIS — E1169 Type 2 diabetes mellitus with other specified complication: Secondary | ICD-10-CM | POA: Diagnosis not present

## 2020-11-10 DIAGNOSIS — B351 Tinea unguium: Secondary | ICD-10-CM | POA: Diagnosis not present

## 2020-11-11 NOTE — Progress Notes (Signed)
Subjective: Erika Nielsen is a pleasant 60 y.o. female patient seen today painful thick toenails that are difficult to trim. Pain interferes with ambulation. Aggravating factors include wearing enclosed shoe gear. Pain is relieved with periodic professional debridement.  Patient states it has been a while since her last visit. States last visit was painful and she considered not coming back. She states her toenails have gotten really long. She voices no new pedal concerns on today's visit.  She states the porokeratotic lesion on the right foot is not painful when she ambulates.  PCP is Ladell Pier, MD. Last visit was: 09/30/2020.  Allergies  Allergen Reactions   Lisinopril Swelling    Objective: Physical Exam  General: Erika Nielsen is a pleasant 60 y.o. African American female, in NAD. AAO x 3.   Vascular:  Capillary refill time to digits immediate b/l. Palpable DP pulse(s) b/l lower extremities Palpable PT pulse(s) b/l lower extremities Pedal hair absent. Lower extremity skin temperature gradient within normal limits. No pain with calf compression b/l. No edema noted b/l lower extremities.  Dermatological:  Pedal skin with normal turgor, texture and tone b/l lower extremities. No open wounds b/l lower extremities. No interdigital macerations b/l lower extremities. Toenails 1-5 b/l elongated, discolored, dystrophic, thickened, crumbly with subungual debris and tenderness to dorsal palpation. Porokeratotic lesion(s) medial arch area right foot. No erythema, no edema, no drainage, no fluctuance. No pain on palpation.  Musculoskeletal:  Normal muscle strength 5/5 to all lower extremity muscle groups bilaterally. Pes planus deformity noted b/l lower extremities. Patient ambulates independent of any assistive aids.  Neurological:  Protective sensation intact 5/5 intact bilaterally with 10g monofilament b/l. Vibratory sensation intact b/l.  Assessment and Plan:  1. Pain due to  onychomycosis of toenails of both feet   2. Diabetes mellitus type 2 in obese [facility])     -Discussed last visit experience with patient. Advised her at any time, I want her to feel comfortable voicing her concerns during the visit. She related understanding. Patient states she had a better experience today.  -Patient to continue soft, supportive shoe gear daily. -Toenails 1-5 b/l were debrided in length and girth with sterile nail nippers and dremel without iatrogenic bleeding.  -Patient to report any pedal injuries to medical professional immediately. -Patient/POA to call should there be question/concern in the interim.  Return in about 3 months (around 02/10/2021).  Marzetta Board, DPM

## 2020-12-20 ENCOUNTER — Other Ambulatory Visit: Payer: Self-pay | Admitting: Internal Medicine

## 2020-12-20 DIAGNOSIS — I10 Essential (primary) hypertension: Secondary | ICD-10-CM

## 2020-12-20 NOTE — Telephone Encounter (Signed)
Requested Prescriptions  Pending Prescriptions Disp Refills  . spironolactone (ALDACTONE) 25 MG tablet [Pharmacy Med Name: Spironolactone 25 MG Oral Tablet] 15 tablet 0    Sig: Take 1/2 (one-half) tablet by mouth once daily     Cardiovascular: Diuretics - Aldosterone Antagonist Failed - 12/20/2020  1:00 PM      Failed - Cr in normal range and within 360 days    Creatinine, Ser  Date Value Ref Range Status  10/02/2020 1.06 (H) 0.57 - 1.00 mg/dL Final         Failed - Last BP in normal range    BP Readings from Last 1 Encounters:  09/30/20 (!) 160/88         Passed - K in normal range and within 360 days    Potassium  Date Value Ref Range Status  10/02/2020 4.2 3.5 - 5.2 mmol/L Final         Passed - Na in normal range and within 360 days    Sodium  Date Value Ref Range Status  10/02/2020 139 134 - 144 mmol/L Final         Passed - Valid encounter within last 6 months    Recent Outpatient Visits          2 months ago Diabetes mellitus type 2 in obese [facility])   [place], MD   3 months ago Essential hypertension   [place], Jarome Matin, RPH-CPP   4 months ago Essential hypertension   [place], Stephen L, RPH-CPP   5 months ago Essential hypertension   [place], Jarome Matin, RPH-CPP   6 months ago Establishing care with new doctor, encounter for   [place], MD      Future Appointments            In 1 month Wynetta Emery Dalbert Batman, MD [place]

## 2021-01-29 ENCOUNTER — Ambulatory Visit: Payer: 59 | Admitting: Internal Medicine

## 2021-02-02 ENCOUNTER — Other Ambulatory Visit: Payer: Self-pay | Admitting: Internal Medicine

## 2021-02-02 DIAGNOSIS — Z1231 Encounter for screening mammogram for malignant neoplasm of breast: Secondary | ICD-10-CM

## 2021-02-03 ENCOUNTER — Other Ambulatory Visit: Payer: Self-pay

## 2021-02-03 ENCOUNTER — Ambulatory Visit
Admission: RE | Admit: 2021-02-03 | Discharge: 2021-02-03 | Disposition: A | Discharge status: Home / Self Care | Payer: 59 | Source: Ambulatory Visit | Attending: Internal Medicine | Admitting: Internal Medicine

## 2021-02-03 DIAGNOSIS — Z1231 Encounter for screening mammogram for malignant neoplasm of breast: Secondary | ICD-10-CM

## 2021-02-17 ENCOUNTER — Other Ambulatory Visit: Payer: Self-pay

## 2021-02-17 ENCOUNTER — Ambulatory Visit (INDEPENDENT_AMBULATORY_CARE_PROVIDER_SITE_OTHER): Payer: 59 | Admitting: Podiatry

## 2021-02-17 DIAGNOSIS — E669 Obesity, unspecified: Secondary | ICD-10-CM

## 2021-02-17 DIAGNOSIS — M79674 Pain in right toe(s): Secondary | ICD-10-CM

## 2021-02-17 DIAGNOSIS — B351 Tinea unguium: Secondary | ICD-10-CM

## 2021-02-17 DIAGNOSIS — E1169 Type 2 diabetes mellitus with other specified complication: Secondary | ICD-10-CM | POA: Diagnosis not present

## 2021-02-17 DIAGNOSIS — M79675 Pain in left toe(s): Secondary | ICD-10-CM | POA: Diagnosis not present

## 2021-02-22 ENCOUNTER — Encounter: Payer: Self-pay | Admitting: Podiatry

## 2021-02-22 NOTE — Progress Notes (Signed)
  Subjective:  Patient ID: Erika Nielsen, female    DOB: [birth year],  MRN: [id number]  Cashe Gatt presents to clinic today for preventative diabetic foot care and painful elongated mycotic toenails 1-5 bilaterally which are tender when wearing enclosed shoe gear. Pain is relieved with periodic professional debridement.  Patient's last A1c was 7.8%.  PCP is Ladell Pier, MD , and last visit was 06/21/52022.  Allergies  Allergen Reactions   Lisinopril Swelling    Review of Systems: Negative except as noted in the HPI. Objective:   Constitutional Erika Nielsen is a pleasant 60 y.o. African American female, in NAD. AAO x 3.   Vascular CFT immediate b/l LE. Faintly palpable DP/PT pulses b/l LE. Digital hair absent b/l. Skin temperature gradient WNL b/l. No pain with calf compression b/l. No edema noted b/l. Lower extremity skin temperature gradient within normal limits. No cyanosis or clubbing noted.  Neurologic Normal speech. Oriented to person, place, and time. Protective sensation intact 5/5 intact bilaterally with 10g monofilament b/l. Vibratory sensation intact b/l. Proprioception intact bilaterally. Lower extremity deep tendon reflexes WNL b/l.   Dermatologic Pedal integument with normal turgor, texture and tone b/l LE. No open wounds b/l. No interdigital macerations b/l. Toenails 1-5 b/l elongated, thickened, discolored with subungual debris. +Tenderness with dorsal palpation of nailplates. Porokeratotic lesion(s) noted medial arch right foot, aymptomatic. No edema, no erythema, no drainage, no fluctuance. Toenails 1-5 b/l elongated, discolored, dystrophic, thickened, crumbly with subungual debris and tenderness to dorsal palpation.  Orthopedic: Normal muscle strength 5/5 to all lower extremity muscle groups bilaterally. Pes planus deformity noted b/l lower extremities. Patient ambulates independent of any assistive aids.   Radiographs: None Assessment:   1. Pain due to  onychomycosis of toenails of both feet   2. Diabetes mellitus type 2 in obese [facility])    Plan:  Patient was evaluated and treated and all questions answered. Consent given for treatment as described below: -Examined patient. -Continue diabetic foot care principles: inspect feet daily, monitor glucose as recommended by PCP and/or Endocrinologist, and follow prescribed diet per PCP, Endocrinologist and/or dietician. -Mycotic toenails 1-5 bilaterally were debrided in length and girth with sterile nail nippers and dremel without incident. -Patient/POA to call should there be question/concern in the interim.  Return in about 3 months (around 05/20/2021).  Marzetta Board, DPM

## 2021-02-25 ENCOUNTER — Other Ambulatory Visit: Payer: Self-pay | Admitting: Internal Medicine

## 2021-02-25 DIAGNOSIS — I10 Essential (primary) hypertension: Secondary | ICD-10-CM

## 2021-02-25 DIAGNOSIS — R252 Cramp and spasm: Secondary | ICD-10-CM

## 2021-02-25 DIAGNOSIS — E1169 Type 2 diabetes mellitus with other specified complication: Secondary | ICD-10-CM

## 2021-02-26 NOTE — Telephone Encounter (Signed)
Requested Prescriptions  Pending Prescriptions Disp Refills  . glipiZIDE (GLUCOTROL) 5 MG tablet [Pharmacy Med Name: glipiZIDE 5 MG Oral Tablet] 180 tablet 0    Sig: TAKE 1 TABLET BY MOUTH TWICE DAILY BEFORE A MEAL     Endocrinology:  Diabetes - Sulfonylureas Passed - 02/25/2021  9:17 PM      Passed - HBA1C is between 0 and 7.9 and within 180 days    HbA1c, POC (controlled diabetic range)  Date Value Ref Range Status  09/30/2020 7.8 (A) 0.0 - 7.0 % Final         Passed - Valid encounter within last 6 months    Recent Outpatient Visits          4 months ago Diabetes mellitus type 2 in obese [facility])   [place], MD   5 months ago Essential hypertension   [place], Stephen L, RPH-CPP   7 months ago Essential hypertension   [place], Stephen L, RPH-CPP   8 months ago Essential hypertension   [place], Stephen L, RPH-CPP   8 months ago Establishing care with new doctor, encounter for   [place], MD      Future Appointments            In 2 weeks Ladell Pier, MD [place]           . spironolactone (ALDACTONE) 25 MG tablet [Pharmacy Med Name: Spironolactone 25 MG Oral Tablet] 15 tablet 2    Sig: Take 1/2 (one-half) tablet by mouth once daily     Cardiovascular: Diuretics - Aldosterone Antagonist Failed - 02/25/2021  9:17 PM      Failed - Cr in normal range and within 360 days    Creatinine, Ser  Date Value Ref Range Status  10/02/2020 1.06 (H) 0.57 - 1.00 mg/dL Final         Failed - Last BP in normal range    BP Readings from Last 1 Encounters:  09/30/20 (!) 160/88         Passed - K in normal range and within 360 days    Potassium  Date Value Ref Range Status  10/02/2020 4.2 3.5 - 5.2 mmol/L Final          Passed - Na in normal range and within 360 days    Sodium  Date Value Ref Range Status  10/02/2020 139 134 - 144 mmol/L Final         Passed - Valid encounter within last 6 months    Recent Outpatient Visits          4 months ago Diabetes mellitus type 2 in obese [facility])   [place], MD   5 months ago Essential hypertension   [place], Jarome Matin, RPH-CPP   7 months ago Essential hypertension   [place], Stephen L, RPH-CPP   8 months ago Essential hypertension   [place], Stephen L, RPH-CPP   8 months ago Establishing care with new doctor, encounter for   [place], MD      Future Appointments  In 2 weeks Ladell Pier, MD [place]           . methocarbamol (ROBAXIN) 500 MG tablet [Pharmacy Med Name: Methocarbamol 500 MG Oral Tablet] 30 tablet 0    Sig: TAKE 1 TABLET BY MOUTH ONCE DAILY AS NEEDED FOR MUSCLE SPASM     Not Delegated - Analgesics:  Muscle Relaxants Failed - 02/25/2021  9:17 PM      Failed - This refill cannot be delegated      Passed - Valid encounter within last 6 months    Recent Outpatient Visits          4 months ago Diabetes mellitus type 2 in obese [facility])   [place], MD   5 months ago Essential hypertension   [place], Stephen L, RPH-CPP   7 months ago Essential hypertension   [place], Stephen L, RPH-CPP   8 months ago Essential hypertension   [place], Stephen L, RPH-CPP   8 months ago Establishing care with new doctor, encounter for   [place], MD      Future Appointments            In 2 weeks Ladell Pier, MD [place]

## 2021-02-26 NOTE — Telephone Encounter (Signed)
Requested medications are due for refill today.  yes  Requested medications are on the active medications list.  yes  Last refill. 09/30/2020  Future visit scheduled.   yes  Notes to clinic.  Medication not delegated.

## 2021-03-17 ENCOUNTER — Other Ambulatory Visit: Payer: Self-pay

## 2021-03-17 ENCOUNTER — Ambulatory Visit: Payer: 59 | Attending: Internal Medicine | Admitting: Internal Medicine

## 2021-03-17 ENCOUNTER — Encounter: Payer: Self-pay | Admitting: Internal Medicine

## 2021-03-17 VITALS — BP 119/81 | HR 87 | Resp 16 | Wt 190.8 lb

## 2021-03-17 DIAGNOSIS — Z6833 Body mass index (BMI) 33.0-33.9, adult: Secondary | ICD-10-CM

## 2021-03-17 DIAGNOSIS — J309 Allergic rhinitis, unspecified: Secondary | ICD-10-CM

## 2021-03-17 DIAGNOSIS — E785 Hyperlipidemia, unspecified: Secondary | ICD-10-CM | POA: Diagnosis not present

## 2021-03-17 DIAGNOSIS — D508 Other iron deficiency anemias: Secondary | ICD-10-CM

## 2021-03-17 DIAGNOSIS — I10 Essential (primary) hypertension: Secondary | ICD-10-CM | POA: Diagnosis not present

## 2021-03-17 DIAGNOSIS — Z23 Encounter for immunization: Secondary | ICD-10-CM | POA: Diagnosis not present

## 2021-03-17 DIAGNOSIS — E1169 Type 2 diabetes mellitus with other specified complication: Secondary | ICD-10-CM | POA: Diagnosis not present

## 2021-03-17 DIAGNOSIS — E669 Obesity, unspecified: Secondary | ICD-10-CM

## 2021-03-17 LAB — POCT GLYCOSYLATED HEMOGLOBIN (HGB A1C): HbA1c, POC (controlled diabetic range): 7.5 % — AB (ref 0.0–7.0)

## 2021-03-17 LAB — GLUCOSE, POCT (MANUAL RESULT ENTRY): POC Glucose: 164 mg/dl — AB (ref 70–99)
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
## 2021-03-17 NOTE — Patient Instructions (Signed)
You can purchase Claritin or its generic brand called loratadine over-the-counter and take 1 daily.

## 2021-03-18 LAB — CBC
Hematocrit: 33.8 % — ABNORMAL LOW (ref 34.0–46.6)
Hemoglobin: 11.3 g/dL (ref 11.1–15.9)
MCH: 29 pg (ref 26.6–33.0)
MCHC: 33.4 g/dL (ref 31.5–35.7)
MCV: 87 fL (ref 79–97)
Platelets: 314 10*3/uL (ref 150–450)
RBC: 3.89 x10E6/uL (ref 3.77–5.28)
RDW: 13.6 % (ref 11.7–15.4)
WBC: 8.3 10*3/uL (ref 3.4–10.8)

## 2021-03-18 LAB — COMPREHENSIVE METABOLIC PANEL
ALT: 27 IU/L (ref 0–32)
AST: 30 IU/L (ref 0–40)
Albumin/Globulin Ratio: 1.5 (ref 1.2–2.2)
Albumin: 4.7 g/dL (ref 3.8–4.9)
Alkaline Phosphatase: 91 IU/L (ref 44–121)
BUN/Creatinine Ratio: 17 (ref 9–23)
BUN: 15 mg/dL (ref 6–24)
Bilirubin Total: <0.2 mg/dL (ref 0.0–1.2)
CO2: 27 mmol/L (ref 20–29)
Calcium: 10.2 mg/dL (ref 8.7–10.2)
Chloride: 98 mmol/L (ref 96–106)
Creatinine, Ser: 0.89 mg/dL (ref 0.57–1.00)
Globulin, Total: 3.2 g/dL (ref 1.5–4.5)
Glucose: 145 mg/dL — ABNORMAL HIGH (ref 70–99)
Potassium: 4.4 mmol/L (ref 3.5–5.2)
Sodium: 140 mmol/L (ref 134–144)
Total Protein: 7.9 g/dL (ref 6.0–8.5)
eGFR: 75 mL/min/{1.73_m2} (ref >59)

## 2021-03-18 LAB — LIPID PANEL
Chol/HDL Ratio: 3.1 ratio (ref 0.0–4.4)
Cholesterol, Total: 150 mg/dL (ref 100–199)
HDL: 48 mg/dL (ref >39)
LDL Chol Calc (NIH): 68 mg/dL (ref 0–99)
Triglycerides: 207 mg/dL — ABNORMAL HIGH (ref 0–149)
VLDL Cholesterol Cal: 34 mg/dL (ref 5–40)

## 2021-03-18 NOTE — Progress Notes (Signed)
Patient has mild but stable anemia compared to when last checked in February of this year. Kidney function and liver function tests are okay. Mild elevation in triglyceride level on cholesterol test but improved compared to last results.  Continue atorvastatin, healthy eating and regular exercise.

## 2021-03-23 ENCOUNTER — Telehealth: Payer: Self-pay

## 2021-03-23 NOTE — Telephone Encounter (Signed)
Contacted pt to go over lab results pt is aware and doesn't have any questions or concerns 

## 2021-03-24 ENCOUNTER — Other Ambulatory Visit: Payer: Self-pay | Admitting: Internal Medicine

## 2021-03-24 DIAGNOSIS — R252 Cramp and spasm: Secondary | ICD-10-CM

## 2021-03-24 NOTE — Telephone Encounter (Signed)
Requested medication (s) are due for refill today: Due 03/28/21  Requested medication (s) are on the active medication list: yes   Last refill: 02/26/21  #30   0 refills  Future visit scheduled yes  05/18/21  Notes to clinic: not delegated  Requested Prescriptions  Pending Prescriptions Disp Refills   methocarbamol (ROBAXIN) 500 MG tablet [Pharmacy Med Name: Methocarbamol 500 MG Oral Tablet] 30 tablet 0    Sig: TAKE 1 TABLET BY MOUTH ONCE DAILY AS NEEDED FOR MUSCLE SPASM     Not Delegated - Analgesics:  Muscle Relaxants Failed - 03/24/2021  5:09 PM      Failed - This refill cannot be delegated      Passed - Valid encounter within last 6 months    Recent Outpatient Visits           1 week ago Diabetes mellitus type 2 in obese [facility])   [place], MD   5 months ago Diabetes mellitus type 2 in obese [facility])   [place], Deborah B, MD   6 months ago Essential hypertension   [place], Stephen L, RPH-CPP   7 months ago Essential hypertension   [place], Stephen L, RPH-CPP   8 months ago Essential hypertension   [place], [place], RPH-CPP       Future Appointments             In 1 month Wynetta Emery, Dalbert Batman, MD [place]

## 2021-04-03 ENCOUNTER — Other Ambulatory Visit: Payer: Self-pay | Admitting: Internal Medicine

## 2021-04-03 DIAGNOSIS — E669 Obesity, unspecified: Secondary | ICD-10-CM

## 2021-04-03 DIAGNOSIS — F410 Panic disorder [episodic paroxysmal anxiety] without agoraphobia: Secondary | ICD-10-CM

## 2021-04-03 NOTE — Telephone Encounter (Signed)
Requested Prescriptions  Pending Prescriptions Disp Refills   sertraline (ZOLOFT) 50 MG tablet [Pharmacy Med Name: Sertraline HCl 50 MG Oral Tablet] 90 tablet 0    Sig: Take 1 tablet by mouth once daily     Psychiatry:  Antidepressants - SSRI Passed - 04/03/2021  5:05 PM      Passed - Valid encounter within last 6 months    Recent Outpatient Visits          2 weeks ago Diabetes mellitus type 2 in obese [facility])   [place] Karle Plumber B, MD   6 months ago Diabetes mellitus type 2 in obese [facility])   [place], Deborah B, MD   6 months ago Essential hypertension   [place], Stephen L, RPH-CPP   8 months ago Essential hypertension   [place], Stephen L, RPH-CPP   9 months ago Essential hypertension   [place], [place], RPH-CPP      Future Appointments            In 1 month Ladell Pier, MD [place]           Refused Prescriptions Disp Refills   glipiZIDE (GLUCOTROL) 5 MG tablet [Pharmacy Med Name: glipiZIDE 5 MG Oral Tablet] 180 tablet 0    Sig: TAKE 1 TABLET BY MOUTH TWICE DAILY BEFORE A MEAL     Endocrinology:  Diabetes - Sulfonylureas Passed - 04/03/2021  5:05 PM      Passed - HBA1C is between 0 and 7.9 and within 180 days    HbA1c, POC (controlled diabetic range)  Date Value Ref Range Status  03/17/2021 7.5 (A) 0.0 - 7.0 % Final         Passed - Valid encounter within last 6 months    Recent Outpatient Visits          2 weeks ago Diabetes mellitus type 2 in obese [facility])   [place], MD   6 months ago Diabetes mellitus type 2 in obese [facility])   [place], Deborah B, MD   6 months ago Essential hypertension   [place], Jarome Matin, RPH-CPP   8 months ago Essential hypertension   [place], Jarome Matin, RPH-CPP   9 months ago Essential hypertension   [place], [place], RPH-CPP      Future Appointments            In 1 month Wynetta Emery, Dalbert Batman, MD [place]

## 2021-05-18 ENCOUNTER — Ambulatory Visit: Payer: 59 | Admitting: Internal Medicine

## 2021-05-18 ENCOUNTER — Other Ambulatory Visit: Payer: Self-pay

## 2021-05-24 ENCOUNTER — Other Ambulatory Visit: Payer: Self-pay | Admitting: Internal Medicine

## 2021-05-24 DIAGNOSIS — I10 Essential (primary) hypertension: Secondary | ICD-10-CM

## 2021-05-24 DIAGNOSIS — E1169 Type 2 diabetes mellitus with other specified complication: Secondary | ICD-10-CM

## 2021-05-25 NOTE — Telephone Encounter (Signed)
Requested Prescriptions  Pending Prescriptions Disp Refills   glipiZIDE (GLUCOTROL) 5 MG tablet [Pharmacy Med Name: glipiZIDE 5 MG Oral Tablet] 180 tablet 0    Sig: TAKE 1 TABLET BY MOUTH TWICE DAILY BEFORE A MEAL     Endocrinology:  Diabetes - Sulfonylureas Passed - 05/24/2021  5:23 PM      Passed - HBA1C is between 0 and 7.9 and within 180 days    HbA1c, POC (controlled diabetic range)  Date Value Ref Range Status  03/17/2021 7.5 (A) 0.0 - 7.0 % Final         Passed - Cr in normal range and within 360 days    Creatinine, Ser  Date Value Ref Range Status  03/17/2021 0.89 0.57 - 1.00 mg/dL Final         Passed - Valid encounter within last 6 months    Recent Outpatient Visits          2 months ago Diabetes mellitus type 2 in obese [facility])   [place] Gorman, Neoma Laming B, MD   7 months ago Diabetes mellitus type 2 in obese [facility])   [place], Deborah B, MD   8 months ago Essential hypertension   [place], Jarome Matin, RPH-CPP   9 months ago Essential hypertension   [place], Jarome Matin, RPH-CPP   10 months ago Essential hypertension   [place], Stephen L, RPH-CPP      Future Appointments            In 1 month Ladell Pier, MD [place]            spironolactone (ALDACTONE) 25 MG tablet [Pharmacy Med Name: Spironolactone 25 MG Oral Tablet] 30 tablet 0    Sig: Take 1/2 (one-half) tablet by mouth once daily     Cardiovascular: Diuretics - Aldosterone Antagonist Passed - 05/24/2021  5:23 PM      Passed - Cr in normal range and within 180 days    Creatinine, Ser  Date Value Ref Range Status  03/17/2021 0.89 0.57 - 1.00 mg/dL Final         Passed - K in normal range and within 180 days    Potassium  Date Value Ref Range Status   03/17/2021 4.4 3.5 - 5.2 mmol/L Final         Passed - Na in normal range and within 180 days    Sodium  Date Value Ref Range Status  03/17/2021 140 134 - 144 mmol/L Final         Passed - eGFR is 30 or above and within 180 days    GFR calc Af Amer  Date Value Ref Range Status  10/23/2019 91 >59 mL/min/1.73 Final    Comment:    **[facility] currently reports eGFR in compliance with the current**   recommendations of the [employer]. [facility] will   update reporting as new guidelines are published from the NKF-ASN   Task force.    GFR calc non Af Amer  Date Value Ref Range Status  10/23/2019 79 >59 mL/min/1.73 Final   eGFR  Date Value Ref Range Status  03/17/2021 75 >59 mL/min/1.73 Final         Passed - Last BP in normal range    BP Readings from Last 1 Encounters:  03/17/21 119/81         Passed - Valid encounter within last 6 months    Recent Outpatient Visits          2 months ago Diabetes mellitus type 2 in obese [facility])   [place], MD   7 months ago Diabetes mellitus type 2 in obese [facility])   [place], Deborah B, MD   8 months ago Essential hypertension   [place], Jarome Matin, RPH-CPP   9 months ago Essential hypertension   [place], Stephen L, RPH-CPP   10 months ago Essential hypertension   [place], RPH-CPP      Future Appointments            In 1 month Wynetta Emery, Dalbert Batman, MD [place]

## 2021-05-29 ENCOUNTER — Other Ambulatory Visit: Payer: Self-pay

## 2021-05-29 ENCOUNTER — Other Ambulatory Visit: Payer: Self-pay | Admitting: Internal Medicine

## 2021-05-29 ENCOUNTER — Telehealth: Payer: Self-pay

## 2021-05-29 DIAGNOSIS — E669 Obesity, unspecified: Secondary | ICD-10-CM

## 2021-05-29 DIAGNOSIS — E1169 Type 2 diabetes mellitus with other specified complication: Secondary | ICD-10-CM

## 2021-05-29 NOTE — Telephone Encounter (Signed)
PA FOR RYBELSUS APPROVED UNTIL 05/29/2022

## 2021-05-29 NOTE — Telephone Encounter (Signed)
PA FOR RYBELSUS SUBMITTED TO INS TODAY. WAITING ON RESPONSE.

## 2021-05-29 NOTE — Telephone Encounter (Signed)
Requested medication (s) are due for refill today: yes  Requested medication (s) are on the active medication list: yes  Last refill:  09/30/20 #30 with 5 RF  Future visit scheduled: 07/20/21  Notes to clinic:  There is no protocol to follow, please assess.      Requested Prescriptions  Pending Prescriptions Disp Refills   Semaglutide (RYBELSUS) 14 MG TABS 30 tablet 5    Sig: Take 1 tablet (14 mg total) by mouth daily.     Off-Protocol Failed - 05/29/2021  2:09 PM      Failed - Medication not assigned to a protocol, review manually.      Passed - Valid encounter within last 12 months    Recent Outpatient Visits           2 months ago Diabetes mellitus type 2 in obese [facility])   [place], MD   8 months ago Diabetes mellitus type 2 in obese [facility])   [place], Deborah B, MD   8 months ago Essential hypertension   [place], Stephen L, RPH-CPP   10 months ago Essential hypertension   [place], Jarome Matin, RPH-CPP   11 months ago Essential hypertension   [place], RPH-CPP       Future Appointments             In 1 month Wynetta Emery, Dalbert Batman, MD [place]

## 2021-05-29 NOTE — Telephone Encounter (Signed)
Medication Refill - Medication:  Semaglutide (RYBELSUS) 14 MG TABS   Has the patient contacted their pharmacy? Yes.   Contact PCP  Preferred Pharmacy (with phone number or street name):  [place] ([place]), [place] - [address]  [address], [place]) [place] [address]  Phone:  [phone]  Fax:  [phone]   Has the patient been seen for an appointment in the last year OR does the patient have an upcoming appointment? Yes.    Agent: Please be advised that RX refills may take up to 3 business days. We ask that you follow-up with your pharmacy.

## 2021-06-02 ENCOUNTER — Encounter: Payer: Self-pay | Admitting: Podiatry

## 2021-06-02 ENCOUNTER — Ambulatory Visit: Payer: 59 | Admitting: Podiatry

## 2021-06-02 ENCOUNTER — Other Ambulatory Visit: Payer: Self-pay

## 2021-06-02 DIAGNOSIS — M2141 Flat foot [pes planus] (acquired), right foot: Secondary | ICD-10-CM | POA: Diagnosis not present

## 2021-06-02 DIAGNOSIS — M2142 Flat foot [pes planus] (acquired), left foot: Secondary | ICD-10-CM

## 2021-06-02 DIAGNOSIS — M79675 Pain in left toe(s): Secondary | ICD-10-CM

## 2021-06-02 DIAGNOSIS — E119 Type 2 diabetes mellitus without complications: Secondary | ICD-10-CM

## 2021-06-02 DIAGNOSIS — B351 Tinea unguium: Secondary | ICD-10-CM

## 2021-06-02 DIAGNOSIS — M79674 Pain in right toe(s): Secondary | ICD-10-CM | POA: Diagnosis not present

## 2021-06-02 MED ORDER — RYBELSUS 14 MG PO TABS: 14.0000 mg | ORAL_TABLET | Freq: Every day | ORAL | 2 refills | Status: DC

## 2021-06-02 NOTE — Progress Notes (Signed)
ANNUAL DIABETIC FOOT EXAM  Subjective: Erika Nielsen presents today for for annual diabetic foot examination.  Patient relates 14 year h/o diabetes.  Patient denies any h/o foot wounds.  Patient denies any numbness, tingling, burning, or pins/needle sensation in feet.  Patient did not check blood glucose this morning.  Risk factors: diabetes, HTN, hyperlipidemia, h/o tobacco use in remission.  Ladell Pier, MD is patient's PCP. Last visit was March 17, 2021.  Past Medical History:  Diagnosis Date   Diabetes mellitus without complication ([place])    Gall stones    Hypertension    Normocytic anemia 02/23/2017   Panic attacks 02/23/2017   Patient Active Problem List   Diagnosis Date Noted   Hyperlipidemia associated with type 2 diabetes mellitus ([place]) 06/02/2020   Prolapsed uterus 08/23/2019   Intramural and subserous leiomyoma of uterus 08/23/2019   Panic attacks 02/23/2017   Diabetes mellitus type 2 in obese ([place]) 07/31/2015   PNEUMONIA, COMMUNITY ACQUIRED, PNEUMOCOCCAL 04/11/2009   OBESITY 12/26/2008   Essential hypertension 04/01/2008   CEREBROVASCULAR DISEASE, ISCHEMIC 02/13/2008   FACIAL PARESTHESIA, LEFT 02/07/2008   ANEMIA, IRON DEFICIENCY 03/28/2007   HEMORRHOIDS 12/22/2006   Eczema 12/22/2006   Contact dermatitis and other eczema, due to unspecified cause 12/22/2006   Past Surgical History:  Procedure Laterality Date   gallstone removal     Current Outpatient Medications on File Prior to Visit  Medication Sig Dispense Refill   albuterol (VENTOLIN HFA) 108 (90 Base) MCG/ACT inhaler Inhale into the lungs.     amLODipine (NORVASC) 10 MG tablet Take 1 tablet (10 mg total) by mouth daily. Dose increase 30 tablet 6   amLODipine (NORVASC) 5 MG tablet Take 5 mg by mouth daily.     aspirin EC 81 MG tablet Take 81 mg by mouth daily. Swallow whole.     atorvastatin (LIPITOR) 20 MG tablet 1 tab by mouth with evening meal once daily 30 tablet 6   BIOTIN PO Take 1  tablet by mouth daily.     blood glucose meter kit and supplies KIT Check blood sugars once to twice daily--especially before breakfast 1 each 0   Calcium Carbonate-Vitamin D (CALCIUM + D PO) Take 1 tablet by mouth 2 (two) times daily.      Calcium Carbonate-Vitamin D 600-200 MG-UNIT CAPS Take by mouth.     ferrous gluconate (FERGON) 324 MG tablet Take 1 tablet (324 mg total) by mouth daily with breakfast.     glipiZIDE (GLUCOTROL) 5 MG tablet TAKE 1 TABLET BY MOUTH TWICE DAILY BEFORE A MEAL 180 tablet 0   glucose blood test strip Twice daily glucose testing 100 each 11   hydrochlorothiazide (HYDRODIURIL) 25 MG tablet Take 1 tablet (25 mg total) by mouth daily. 90 tablet 3   methocarbamol (ROBAXIN) 500 MG tablet TAKE 1 TABLET BY MOUTH ONCE DAILY AS NEEDED FOR MUSCLE SPASM 30 tablet 3   Semaglutide (RYBELSUS) 14 MG TABS Take 1 tablet (14 mg total) by mouth daily. 30 tablet 2   sertraline (ZOLOFT) 50 MG tablet Take 1 tablet by mouth once daily 90 tablet 0   spironolactone (ALDACTONE) 25 MG tablet Take 1/2 (one-half) tablet by mouth once daily 30 tablet 0   triamcinolone cream (KENALOG) 0.1 % Apply 1 application topically 2 (two) times daily. 80 g 1   No current facility-administered medications on file prior to visit.    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  Tobacco Use   Smoking status: Former   Smokeless tobacco: Never  Substance and Sexual Activity   Alcohol use: Yes    Alcohol/week: 0.0 standard drinks    Comment: occasional   Drug use: No   Sexual activity: Yes    Partners: Male    Birth control/protection: Post-menopausal   Family History  Problem Relation Age of Onset   Hypertension Mother    Cancer Maternal Grandfather    Immunization History  Administered Date(s) Administered   Influenza Split 03/21/2015   Influenza Whole 05/01/2009, 04/14/2010    Influenza,inj,Quad PF,6+ Mos 03/09/2016, 06/02/2020, 03/17/2021   Influenza-Unspecified 01/09/2017, 02/25/2018   Moderna Sars-Covid-2 Vaccination 07/05/2019, 08/07/2019   PPD Test 09/10/2020   Pneumococcal Conjugate (Pcv15) 03/17/2021   Pneumococcal Polysaccharide-23 05/01/2009   Td 10/11/2002   Tdap 02/23/2017     Review of Systems: Negative except as noted in the HPI.   Objective: There were no vitals filed for this visit.  Erika Nielsen is a pleasant 61 y.o. female in NAD. AAO X 3.  Vascular Examination: CFT <3 seconds b/l LE. Palpable DP/PT pulses b/l LE. Digital hair absent b/l. Skin temperature gradient WNL b/l. No pain with calf compression b/l. No edema noted b/l. No cyanosis or clubbing noted b/l LE.  Dermatological Examination: Pedal integument with normal turgor, texture and tone BLE. Asymptomatic porokeratosis plantarmedial arch right foot. No erythema, no edema, no pain on palpation, no fluctuance. No open wounds b/l LE. No interdigital macerations noted b/l LE. Toenails 1-5 b/l elongated, discolored, dystrophic, thickened, crumbly with subungual debris and tenderness to dorsal palpation.  Musculoskeletal Examination: Normal muscle strength 5/5 to all lower extremity muscle groups bilaterally. Pes planus deformity noted bilateral LE.Marland Kitchen No pain, crepitus or joint limitation noted with ROM b/l LE.  Patient ambulates independently without assistive aids.  Footwear Assessment: Does the patient wear appropriate shoes? Yes. Does the patient need inserts/orthotics? No.  Neurological Examination: Protective sensation intact 5/5 intact bilaterally with 10g monofilament b/l. Vibratory sensation intact b/l.  Hemoglobin A1C Latest Ref Rng & Units 03/17/2021 09/30/2020  HGBA1C 0.0 - 7.0 % 7.5(A) 7.8(A)  Some recent data might be hidden   Assessment: 1. Pain due to onychomycosis of toenails of both feet   2. Pes planus of both feet   3. Controlled type 2 diabetes mellitus  without complication, without long-term current use of insulin ([place])   4. Encounter for diabetic foot exam ([place])     ADA Risk Categorization: Low Risk :  Patient has all of the following: Intact protective sensation No prior foot ulcer  No severe deformity Pedal pulses present  Plan: -Diabetic foot examination performed today. -Continue foot and shoe inspections daily. Monitor blood glucose per PCP/Endocrinologist's recommendations. -Toenails 1-5 b/l were debrided in length and girth with sterile nail nippers and dremel without iatrogenic bleeding.  -Patient/POA to call should there be question/concern in the interim.  Return in about 3 months (around 08/30/2021).  Marzetta Board, DPM

## 2021-06-08 ENCOUNTER — Encounter: Payer: Self-pay | Admitting: Podiatry

## 2021-06-10 LAB — HM DIABETES EYE EXAM

## 2021-07-02 ENCOUNTER — Other Ambulatory Visit: Payer: Self-pay | Admitting: Internal Medicine

## 2021-07-02 DIAGNOSIS — I10 Essential (primary) hypertension: Secondary | ICD-10-CM

## 2021-07-14 ENCOUNTER — Other Ambulatory Visit: Payer: Self-pay | Admitting: Internal Medicine

## 2021-07-14 DIAGNOSIS — E1169 Type 2 diabetes mellitus with other specified complication: Secondary | ICD-10-CM

## 2021-07-14 DIAGNOSIS — F410 Panic disorder [episodic paroxysmal anxiety] without agoraphobia: Secondary | ICD-10-CM

## 2021-07-20 ENCOUNTER — Encounter: Payer: Self-pay | Admitting: Internal Medicine

## 2021-07-20 ENCOUNTER — Ambulatory Visit: Payer: 59 | Attending: Internal Medicine | Admitting: Internal Medicine

## 2021-07-20 VITALS — BP 129/79 | HR 75 | Resp 16 | Wt 187.6 lb

## 2021-07-20 DIAGNOSIS — I10 Essential (primary) hypertension: Secondary | ICD-10-CM | POA: Diagnosis not present

## 2021-07-20 DIAGNOSIS — N951 Menopausal and female climacteric states: Secondary | ICD-10-CM | POA: Diagnosis not present

## 2021-07-20 DIAGNOSIS — E669 Obesity, unspecified: Secondary | ICD-10-CM | POA: Diagnosis not present

## 2021-07-20 DIAGNOSIS — E1169 Type 2 diabetes mellitus with other specified complication: Secondary | ICD-10-CM

## 2021-07-20 DIAGNOSIS — E785 Hyperlipidemia, unspecified: Secondary | ICD-10-CM

## 2021-07-20 LAB — POCT GLYCOSYLATED HEMOGLOBIN (HGB A1C): HbA1c, POC (controlled diabetic range): 8 % — AB (ref 0.0–7.0)

## 2021-07-20 LAB — GLUCOSE, POCT (MANUAL RESULT ENTRY): POC Glucose: 129 mg/dl — AB (ref 70–99)

## 2021-07-20 MED ORDER — GABAPENTIN 300 MG PO CAPS: 300.0000 mg | ORAL_CAPSULE | Freq: Every day | ORAL | 3 refills | Status: DC

## 2021-07-20 MED ORDER — METFORMIN HCL ER 500 MG PO TB24: 500.0000 mg | ORAL_TABLET | Freq: Every day | ORAL | 1 refills | Status: DC
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
## 2021-07-20 NOTE — Patient Instructions (Signed)

## 2021-07-22 LAB — MICROALBUMIN / CREATININE URINE RATIO
Creatinine, Urine: 65 mg/dL
Microalb/Creat Ratio: 9 mg/g creat (ref 0–29)
Microalbumin, Urine: 6.1 ug/mL

## 2021-07-23 ENCOUNTER — Telehealth: Payer: Self-pay

## 2021-07-23 NOTE — Telephone Encounter (Signed)
Contacted pt to go over lab results pt is aware and doesn't have any questions or concerns 

## 2021-08-15 ENCOUNTER — Other Ambulatory Visit: Payer: Self-pay | Admitting: Internal Medicine

## 2021-08-15 DIAGNOSIS — I10 Essential (primary) hypertension: Secondary | ICD-10-CM

## 2021-08-17 ENCOUNTER — Other Ambulatory Visit: Payer: Self-pay | Admitting: Internal Medicine

## 2021-08-17 DIAGNOSIS — E1169 Type 2 diabetes mellitus with other specified complication: Secondary | ICD-10-CM

## 2021-08-18 NOTE — Telephone Encounter (Signed)
Requested Prescriptions  ?Pending Prescriptions Disp Refills  ?? atorvastatin (LIPITOR) 20 MG tablet [Pharmacy Med Name: Atorvastatin Calcium 20 MG Oral Tablet] 30 tablet 0  ?  Sig: TAKE 1 TABLET BY MOUTH ONCE DAILY WITH EVENING MEAL  ?  ? Cardiovascular:  Antilipid - Statins Failed - 08/17/2021  5:31 AM  ?  ?  Failed - Lipid Panel in normal range within the last 12 months  ?  Cholesterol, Total  ?Date Value Ref Range Status  ?03/17/2021 150 100 - 199 mg/dL Final  ? ?LDL Chol Calc (NIH)  ?Date Value Ref Range Status  ?03/17/2021 68 0 - 99 mg/dL Final  ? ?HDL  ?Date Value Ref Range Status  ?03/17/2021 48 >39 mg/dL Final  ? ?Triglycerides  ?Date Value Ref Range Status  ?03/17/2021 207 (H) 0 - 149 mg/dL Final  ? ?  ?  ?  Passed - Patient is not pregnant  ?  ?  Passed - Valid encounter within last 12 months  ?  Recent Outpatient Visits   ?      ? 4 weeks ago Diabetes mellitus type 2 in obese [facility])  ? [place] Ladell Pier, MD  ? 5 months ago Diabetes mellitus type 2 in obese [facility])  ? [place] Ladell Pier, MD  ? 10 months ago Diabetes mellitus type 2 in obese [facility])  ? [place] Ladell Pier, MD  ? 11 months ago Essential hypertension  ? [place], RPH-CPP  ? 1 year ago Essential hypertension  ? [place], RPH-CPP  ?  ?  ?Future Appointments   ?        ? In 3 months Ladell Pier, MD [place]  ?  ? ?  ?  ?  ? ?

## 2021-08-24 ENCOUNTER — Other Ambulatory Visit: Payer: Self-pay | Admitting: Internal Medicine

## 2021-08-24 DIAGNOSIS — F410 Panic disorder [episodic paroxysmal anxiety] without agoraphobia: Secondary | ICD-10-CM

## 2021-08-24 NOTE — Telephone Encounter (Signed)
Will forward to provider  

## 2021-09-01 ENCOUNTER — Ambulatory Visit (INDEPENDENT_AMBULATORY_CARE_PROVIDER_SITE_OTHER): Payer: 59 | Admitting: Podiatry

## 2021-09-01 ENCOUNTER — Encounter: Payer: Self-pay | Admitting: Podiatry

## 2021-09-01 DIAGNOSIS — M79674 Pain in right toe(s): Secondary | ICD-10-CM | POA: Diagnosis not present

## 2021-09-01 DIAGNOSIS — M79675 Pain in left toe(s): Secondary | ICD-10-CM

## 2021-09-01 DIAGNOSIS — Q828 Other specified congenital malformations of skin: Secondary | ICD-10-CM | POA: Diagnosis not present

## 2021-09-01 DIAGNOSIS — E119 Type 2 diabetes mellitus without complications: Secondary | ICD-10-CM | POA: Diagnosis not present

## 2021-09-01 DIAGNOSIS — B351 Tinea unguium: Secondary | ICD-10-CM

## 2021-09-07 NOTE — Progress Notes (Signed)
  Subjective:  Patient ID: Erika Nielsen, female    DOB: Aug 30, 1960,  MRN: [id number]  Erika Nielsen presents to clinic today for painful elongated mycotic toenails 1-5 bilaterally which are tender when wearing enclosed shoe gear. Pain is relieved with periodic professional debridement.  New problem(s): Patient would like lesion medial aspect right arch addressed on today's visit. Has become tender with weightbearing.  PCP is Ladell Pier, MD , and last visit was July 20, 2021.  Allergies  Allergen Reactions   Lisinopril Swelling    Review of Systems: Negative except as noted in the HPI.  Objective: No changes noted in today's physical examination. There were no vitals filed for this visit.  Erika Nielsen is a pleasant 61 y.o. female in NAD. AAO X 3.  Vascular Examination: CFT <3 seconds b/l LE. Palpable DP/PT pulses b/l LE. Digital hair absent b/l. Skin temperature gradient WNL b/l. No pain with calf compression b/l. No edema noted b/l. No cyanosis or clubbing noted b/l LE.  Dermatological Examination: Pedal integument with normal turgor, texture and tone BLE. Symptomatic porokeratosis plantarmedial arch right foot. No erythema, no edema, no pain on palpation, no fluctuance. No open wounds b/l LE. No interdigital macerations noted b/l LE. Toenails 1-5 b/l elongated, discolored, dystrophic, thickened, crumbly with subungual debris and tenderness to dorsal palpation.  Musculoskeletal Examination: Normal muscle strength 5/5 to all lower extremity muscle groups bilaterally. Pes planus deformity noted bilateral LE.Marland Kitchen No pain, crepitus or joint limitation noted with ROM b/l LE.  Patient ambulates independently without assistive aids.  Neurological Examination: Protective sensation intact 5/5 intact bilaterally with 10g monofilament b/l. Vibratory sensation intact b/l.     Latest Ref Rng & Units 07/20/2021    3:35 PM 03/17/2021    2:39 PM 09/30/2020    2:16 PM  Hemoglobin A1C   Hemoglobin-A1c 0.0 - 7.0 % 8.0   7.5   7.8     Assessment/Plan: 1. Pain due to onychomycosis of toenails of both feet   2. Porokeratosis   3. Controlled type 2 diabetes mellitus without complication, without long-term current use of insulin ([place])     -Examined patient. -Continue diabetic foot care principles: inspect feet daily, monitor glucose as recommended by PCP and/or Endocrinologist, and follow prescribed diet per PCP, Endocrinologist and/or dietician. -Mycotic toenails 1-5 bilaterally were debrided in length and girth with sterile nail nippers and dremel without incident. -Porokeratotic lesion gently filed with dremel tool to patient's comfort level without incident. -Patient/POA to call should there be question/concern in the interim.   Return in about 3 months (around 12/02/2021).  Marzetta Board, DPM

## 2021-09-15 ENCOUNTER — Other Ambulatory Visit: Payer: Self-pay | Admitting: Internal Medicine

## 2021-09-15 DIAGNOSIS — I10 Essential (primary) hypertension: Secondary | ICD-10-CM

## 2021-09-21 ENCOUNTER — Other Ambulatory Visit: Payer: Self-pay | Admitting: Internal Medicine

## 2021-09-21 DIAGNOSIS — E1169 Type 2 diabetes mellitus with other specified complication: Secondary | ICD-10-CM

## 2021-09-22 NOTE — Telephone Encounter (Signed)
Discontinued 09/30/20 Requested Prescriptions  Pending Prescriptions Disp Refills  . amLODipine (NORVASC) 5 MG tablet [Pharmacy Med Name: amLODIPine Besylate 5 MG Oral Tablet] 90 tablet 0    Sig: Take 1 tablet by mouth once daily     Cardiovascular: Calcium Channel Blockers 2 Passed - 09/21/2021  4:29 PM      Passed - Last BP in normal range    BP Readings from Last 1 Encounters:  07/20/21 129/79         Passed - Last Heart Rate in normal range    Pulse Readings from Last 1 Encounters:  07/20/21 75         Passed - Valid encounter within last 6 months    Recent Outpatient Visits          2 months ago Diabetes mellitus type 2 in obese [facility])   [place] Karle Plumber B, MD   6 months ago Diabetes mellitus type 2 in obese [facility])   [place], MD   11 months ago Diabetes mellitus type 2 in obese [facility])   [place], Deborah B, MD   1 year ago Essential hypertension   [place], Jarome Matin, RPH-CPP   1 year ago Essential hypertension   [place], RPH-CPP      Future Appointments            In 1 month Wynetta Emery, Dalbert Batman, MD [place]

## 2021-09-24 ENCOUNTER — Ambulatory Visit: Payer: 59 | Admitting: Nurse Practitioner

## 2021-10-10 ENCOUNTER — Other Ambulatory Visit: Payer: Self-pay | Admitting: Internal Medicine

## 2021-10-10 DIAGNOSIS — I10 Essential (primary) hypertension: Secondary | ICD-10-CM

## 2021-10-12 NOTE — Telephone Encounter (Signed)
Requested Prescriptions  Pending Prescriptions Disp Refills  . amLODipine (NORVASC) 10 MG tablet [Pharmacy Med Name: amLODIPine Besylate 10 MG Oral Tablet] 30 tablet 0    Sig: TAKE 1 TABLET BY MOUTH ONCE DAILY DOSE  INCREASE     Cardiovascular: Calcium Channel Blockers 2 Passed - 10/10/2021 10:56 AM      Passed - Last BP in normal range    BP Readings from Last 1 Encounters:  07/20/21 129/79         Passed - Last Heart Rate in normal range    Pulse Readings from Last 1 Encounters:  07/20/21 75         Passed - Valid encounter within last 6 months    Recent Outpatient Visits          2 months ago Diabetes mellitus type 2 in obese [facility])   [place], MD   6 months ago Diabetes mellitus type 2 in obese [facility])   [place], Deborah B, MD   1 year ago Diabetes mellitus type 2 in obese [facility])   [place], Deborah B, MD   1 year ago Essential hypertension   [place], Jarome Matin, RPH-CPP   1 year ago Essential hypertension   [place], RPH-CPP      Future Appointments            In 1 month Wynetta Emery, Dalbert Batman, MD [place]

## 2021-11-02 ENCOUNTER — Other Ambulatory Visit: Payer: Self-pay | Admitting: Internal Medicine

## 2021-11-07 ENCOUNTER — Other Ambulatory Visit: Payer: Self-pay | Admitting: Internal Medicine

## 2021-11-07 DIAGNOSIS — E1169 Type 2 diabetes mellitus with other specified complication: Secondary | ICD-10-CM

## 2021-11-09 NOTE — Telephone Encounter (Signed)
Requested medications are due for refill today.  yes  Requested medications are on the active medications list.  yes  Last refill. 06/02/2021 #30 2 refills  Future visit scheduled.   yes  Notes to clinic.  Medication not assigned to a protocol  - provider to review.    Requested Prescriptions  Pending Prescriptions Disp Refills   RYBELSUS 14 MG TABS [Pharmacy Med Name: Rybelsus 14 MG Oral Tablet] 30 tablet 0    Sig: Take 1 tablet by mouth once daily     Off-Protocol Failed - 11/07/2021  7:46 AM      Failed - Medication not assigned to a protocol, review manually.      Passed - Valid encounter within last 12 months    Recent Outpatient Visits           3 months ago Diabetes mellitus type 2 in obese [facility])   [place] Karle Plumber B, MD   7 months ago Diabetes mellitus type 2 in obese [facility])   [place], Deborah B, MD   1 year ago Diabetes mellitus type 2 in obese [facility])   [place], Deborah B, MD   1 year ago Essential hypertension   [place], RPH-CPP   1 year ago Essential hypertension   [place], RPH-CPP       Future Appointments             In 1 week Ladell Pier, MD [employer]

## 2021-11-15 ENCOUNTER — Other Ambulatory Visit: Payer: Self-pay | Admitting: Internal Medicine

## 2021-11-15 DIAGNOSIS — I10 Essential (primary) hypertension: Secondary | ICD-10-CM

## 2021-11-15 DIAGNOSIS — E669 Obesity, unspecified: Secondary | ICD-10-CM

## 2021-11-17 ENCOUNTER — Other Ambulatory Visit: Payer: Self-pay | Admitting: Internal Medicine

## 2021-11-17 DIAGNOSIS — R252 Cramp and spasm: Secondary | ICD-10-CM

## 2021-11-18 NOTE — Telephone Encounter (Signed)
Requested medication (s) are due for refill today: yes  Requested medication (s) are on the active medication list: yes  Last refill:  03/26/21 #30  Future visit scheduled: yes  Notes to clinic:  med not delegated to NT to RF   Requested Prescriptions  Pending Prescriptions Disp Refills   methocarbamol (ROBAXIN) 500 MG tablet [Pharmacy Med Name: Methocarbamol 500 MG Oral Tablet] 30 tablet 0    Sig: TAKE 1 TABLET BY MOUTH ONCE DAILY AS NEEDED FOR MUSCLE SPASM     Not Delegated - Analgesics:  Muscle Relaxants Failed - 11/17/2021  5:11 PM      Failed - This refill cannot be delegated      Passed - Valid encounter within last 6 months    Recent Outpatient Visits           4 months ago Diabetes mellitus type 2 in obese [facility])   [place] Karle Plumber B, MD   8 months ago Diabetes mellitus type 2 in obese [facility])   [place], Deborah B, MD   1 year ago Diabetes mellitus type 2 in obese [facility])   [place], Deborah B, MD   1 year ago Essential hypertension   [place], Jarome Matin, RPH-CPP   1 year ago Essential hypertension   [place], Jarome Matin, RPH-CPP       Future Appointments             Tomorrow Ladell Pier, MD [place]

## 2021-11-19 ENCOUNTER — Ambulatory Visit: Payer: 59 | Attending: Internal Medicine | Admitting: Internal Medicine

## 2021-11-19 ENCOUNTER — Encounter: Payer: Self-pay | Admitting: Internal Medicine

## 2021-11-19 VITALS — BP 125/65 | HR 73 | Temp 98.1°F | Ht 64.0 in | Wt 175.2 lb

## 2021-11-19 DIAGNOSIS — Z23 Encounter for immunization: Secondary | ICD-10-CM | POA: Diagnosis not present

## 2021-11-19 DIAGNOSIS — E1159 Type 2 diabetes mellitus with other circulatory complications: Secondary | ICD-10-CM | POA: Diagnosis not present

## 2021-11-19 DIAGNOSIS — E785 Hyperlipidemia, unspecified: Secondary | ICD-10-CM

## 2021-11-19 DIAGNOSIS — E11649 Type 2 diabetes mellitus with hypoglycemia without coma: Secondary | ICD-10-CM | POA: Diagnosis not present

## 2021-11-19 DIAGNOSIS — I152 Hypertension secondary to endocrine disorders: Secondary | ICD-10-CM | POA: Diagnosis not present

## 2021-11-19 DIAGNOSIS — E1169 Type 2 diabetes mellitus with other specified complication: Secondary | ICD-10-CM | POA: Diagnosis not present

## 2021-11-19 DIAGNOSIS — Z6830 Body mass index (BMI) 30.0-30.9, adult: Secondary | ICD-10-CM

## 2021-11-19 DIAGNOSIS — E669 Obesity, unspecified: Secondary | ICD-10-CM

## 2021-11-19 LAB — POCT GLYCOSYLATED HEMOGLOBIN (HGB A1C): HbA1c, POC (controlled diabetic range): 6.3 % (ref 0.0–7.0)

## 2021-11-19 LAB — GLUCOSE, POCT (MANUAL RESULT ENTRY)
POC Glucose: 56 mg/dl — AB (ref 70–99)
POC Glucose: 81 mg/dl (ref 70–99)

## 2021-11-19 MED ORDER — GLIPIZIDE 5 MG PO TABS: 5.0000 mg | ORAL_TABLET | Freq: Every day | ORAL | 4 refills | Status: DC

## 2021-11-19 MED ORDER — METFORMIN HCL ER 500 MG PO TB24: 500.0000 mg | ORAL_TABLET | Freq: Every day | ORAL | 1 refills | Status: DC

## 2021-11-19 MED ORDER — HYDROCHLOROTHIAZIDE 25 MG PO TABS: 25.0000 mg | ORAL_TABLET | Freq: Every day | ORAL | 1 refills | Status: DC

## 2021-11-19 MED ORDER — ATORVASTATIN CALCIUM 20 MG PO TABS: 20.0000 mg | ORAL_TABLET | Freq: Every day | ORAL | 1 refills | Status: DC

## 2021-11-19 MED ORDER — SPIRONOLACTONE 25 MG PO TABS: ORAL_TABLET | ORAL | 6 refills | Status: DC

## 2021-11-19 MED ORDER — AMLODIPINE BESYLATE 10 MG PO TABS: ORAL_TABLET | ORAL | 1 refills | Status: DC
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
## 2021-11-19 NOTE — Patient Instructions (Addendum)
Decrease Glipizide from 5 mg twice a day to just once a day.  If you continue to get blood sugars below 80, then stop the glipizide altogether.

## 2021-11-20 LAB — CBC
Hematocrit: 29.6 % — ABNORMAL LOW (ref 34.0–46.6)
Hemoglobin: 9.9 g/dL — ABNORMAL LOW (ref 11.1–15.9)
MCH: 28.9 pg (ref 26.6–33.0)
MCHC: 33.4 g/dL (ref 31.5–35.7)
MCV: 86 fL (ref 79–97)
Platelets: 246 10*3/uL (ref 150–450)
RBC: 3.43 x10E6/uL — ABNORMAL LOW (ref 3.77–5.28)
RDW: 13.4 % (ref 11.7–15.4)
WBC: 8 10*3/uL (ref 3.4–10.8)

## 2021-12-06 ENCOUNTER — Other Ambulatory Visit: Payer: Self-pay | Admitting: Internal Medicine

## 2021-12-06 DIAGNOSIS — E669 Obesity, unspecified: Secondary | ICD-10-CM

## 2021-12-06 DIAGNOSIS — E1169 Type 2 diabetes mellitus with other specified complication: Secondary | ICD-10-CM

## 2021-12-07 ENCOUNTER — Ambulatory Visit (INDEPENDENT_AMBULATORY_CARE_PROVIDER_SITE_OTHER): Payer: 59 | Admitting: Podiatrist

## 2021-12-07 ENCOUNTER — Encounter: Payer: Self-pay | Admitting: Podiatrist

## 2021-12-07 DIAGNOSIS — B351 Tinea unguium: Secondary | ICD-10-CM | POA: Diagnosis not present

## 2021-12-07 DIAGNOSIS — M79675 Pain in left toe(s): Secondary | ICD-10-CM

## 2021-12-07 DIAGNOSIS — M79674 Pain in right toe(s): Secondary | ICD-10-CM

## 2021-12-07 DIAGNOSIS — E119 Type 2 diabetes mellitus without complications: Secondary | ICD-10-CM

## 2021-12-07 NOTE — Telephone Encounter (Signed)
Requested medication (s) are due for refill today: yes  Requested medication (s) are on the active medication list: yes  Last refill:  11/09/21  #30 with 0 RF  Future visit scheduled: yes, 03/22/22, just seen 11/19/21  Notes to clinic:  There is not a protocol to follow for this med, please assess.       Requested Prescriptions  Pending Prescriptions Disp Refills   RYBELSUS 14 MG TABS [Pharmacy Med Name: Rybelsus 14 MG Oral Tablet] 30 tablet 0    Sig: Take 1 tablet by mouth once daily     Off-Protocol Failed - 12/06/2021  1:36 PM      Failed - Medication not assigned to a protocol, review manually.      Passed - Valid encounter within last 12 months    Recent Outpatient Visits           2 weeks ago Diabetes mellitus type 2 in obese [facility])   [place] Karle Plumber B, MD   4 months ago Diabetes mellitus type 2 in obese [facility])   [place], MD   8 months ago Diabetes mellitus type 2 in obese [facility])   [place], Deborah B, MD   1 year ago Diabetes mellitus type 2 in obese [facility])   [place], Deborah B, MD   1 year ago Essential hypertension   [place], RPH-CPP       Future Appointments             In 3 months Wynetta Emery, Dalbert Batman, MD [place]

## 2021-12-07 NOTE — Progress Notes (Signed)
Subjective:  Patient ID: Erika Nielsen, female    DOB: Aug 27, 1960,  MRN: [id number]   Erika Nielsen presents to clinic today for painful elongated mycotic toenails 1-5 bilaterally which are tender when wearing enclosed shoe gear. Pain is relieved with periodic professional debridement.   The lesion on the medial arch is not bothersome today.     PCP is Ladell Pier, MD , and last visit was November 19, 2021.       Allergies  Allergen Reactions   Lisinopril Swelling      Review of Systems: Negative except as noted in the HPI.     Erika Nielsen is a pleasant 61 y.o. female in NAD. AAO X 3.   Vascular Examination: CFT <3 seconds b/l LE. Palpable DP/PT pulses b/l LE. Digital hair absent b/l. Skin temperature gradient WNL b/l. No pain with calf compression b/l. No edema noted b/l. No cyanosis or clubbing noted b/l LE.   Dermatological Examination: Pedal integument with normal turgor, texture and tone BLE. Asymptomatic porokeratosis plantarmedial arch right foot. No erythema, no edema, no pain on palpation, no fluctuance. No open wounds b/l LE. No interdigital macerations noted b/l LE. Toenails 1-5 b/l elongated, discolored, dystrophic, thickened, crumbly with subungual debris and tenderness to dorsal palpation.   Musculoskeletal Examination: Normal muscle strength 5/5 to all lower extremity muscle groups bilaterally. Pes planus deformity noted bilateral LE.Marland Kitchen No pain, crepitus or joint limitation noted with ROM b/l LE.  Patient ambulates independently without assistive aids.   Neurological Examination: Protective sensation intact 5/5 intact bilaterally with 10g monofilament b/l. Vibratory sensation intact b/l.  ASSESSMENT:      ICD-10-CM   1. Pain due to onychomycosis of toenails of both feet  B35.1    M79.675    M79.674     2. Controlled type 2 diabetes mellitus without complication, without long-term current use of insulin (HCC)  E11.9        -Examined  patient. -Mycotic toenails 1-5 bilaterally were debrided in length and girth with sterile nail nippers and dremel without incident. -Patient/POA to call should there be question/concern in the interim.    Return in about 3 months

## 2022-03-22 ENCOUNTER — Ambulatory Visit: Payer: 59 | Admitting: Internal Medicine

## 2022-03-23 ENCOUNTER — Ambulatory Visit: Payer: 59 | Admitting: Podiatry

## 2022-03-23 IMAGING — MG MM DIGITAL SCREENING BILAT W/ TOMO AND CAD
6 of 12 series · 6 of 36 positions shown · non-contrast
Comparison: Previous exam(s).

CLINICAL DATA: Screening.

EXAM:
DIGITAL SCREENING BILATERAL MAMMOGRAM WITH TOMOSYNTHESIS AND CAD
TECHNIQUE: Bilateral screening digital craniocaudal and mediolateral oblique
mammograms were obtained. Bilateral screening digital breast
tomosynthesis was performed. The images were evaluated with
computer-aided detection.

[L CC synth-2D (1 of 2)]
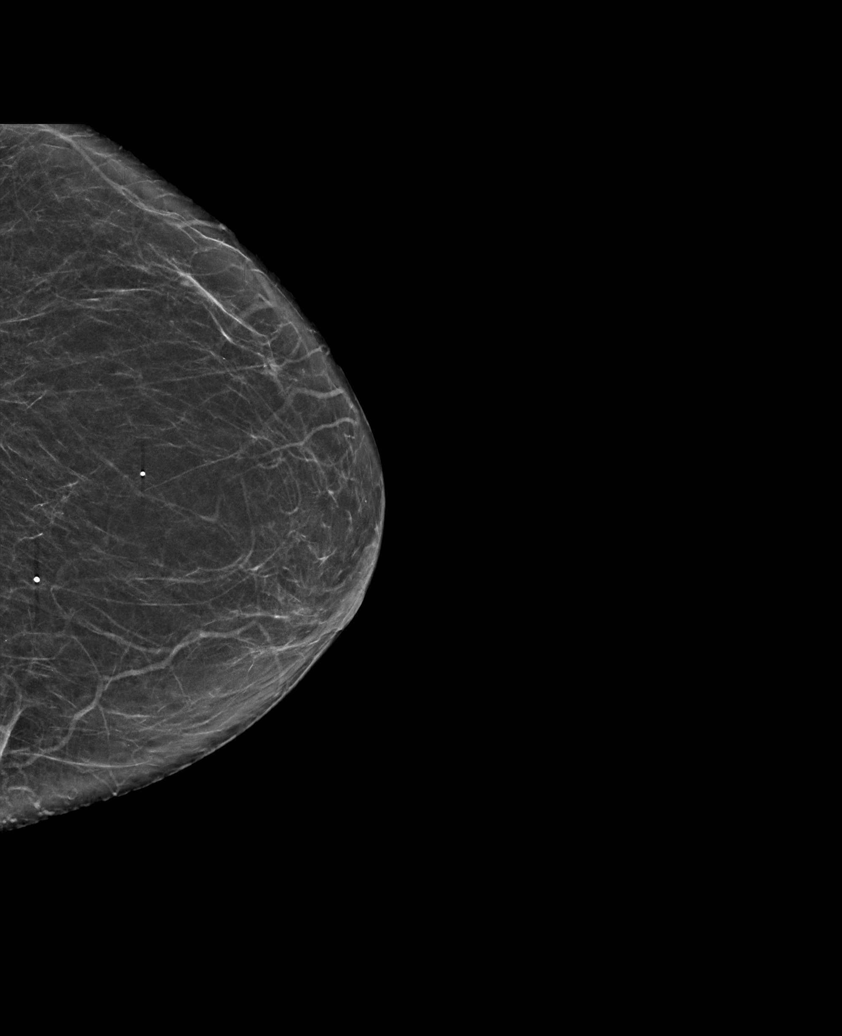

[L CC synth-2D (2 of 2)]
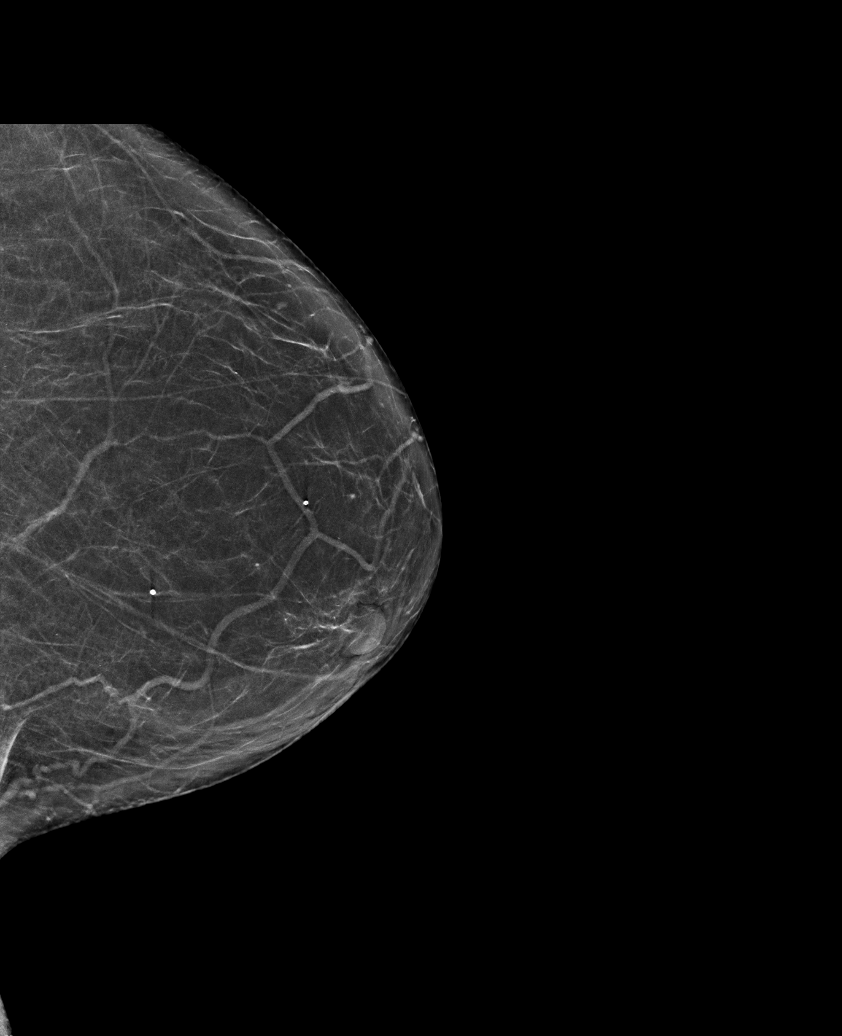

[R CC synth-2D (1 of 2)]
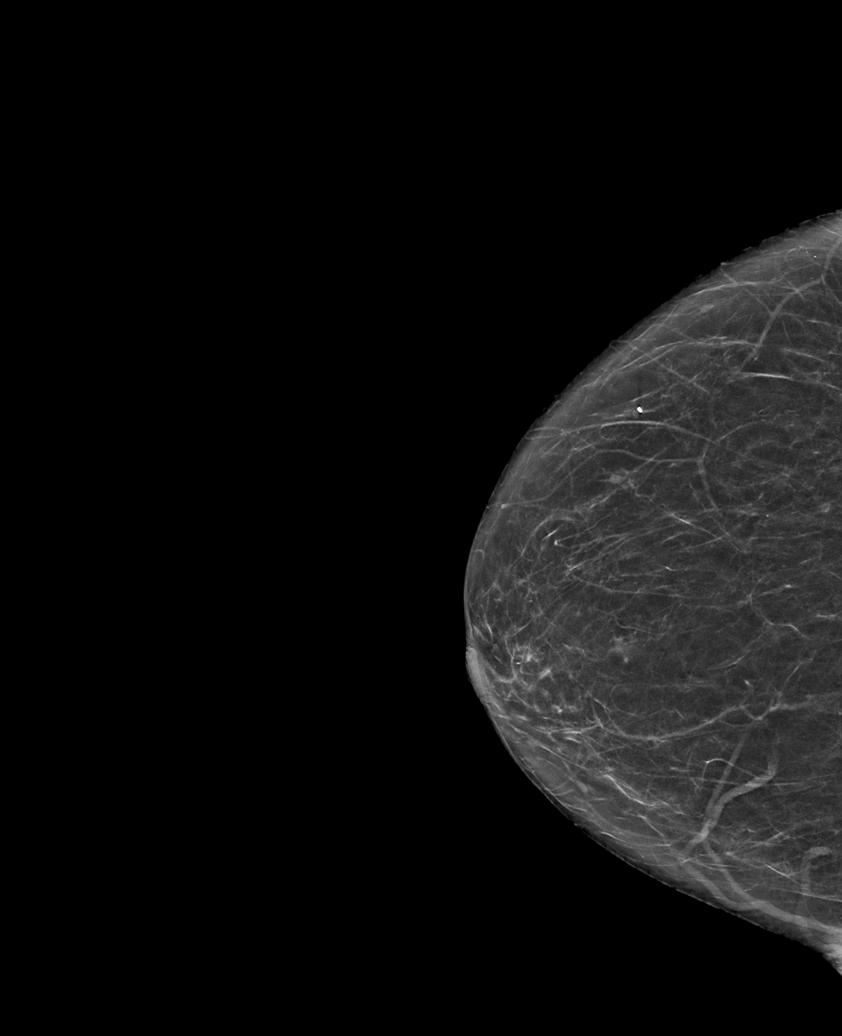

[R MLO synth-2D]
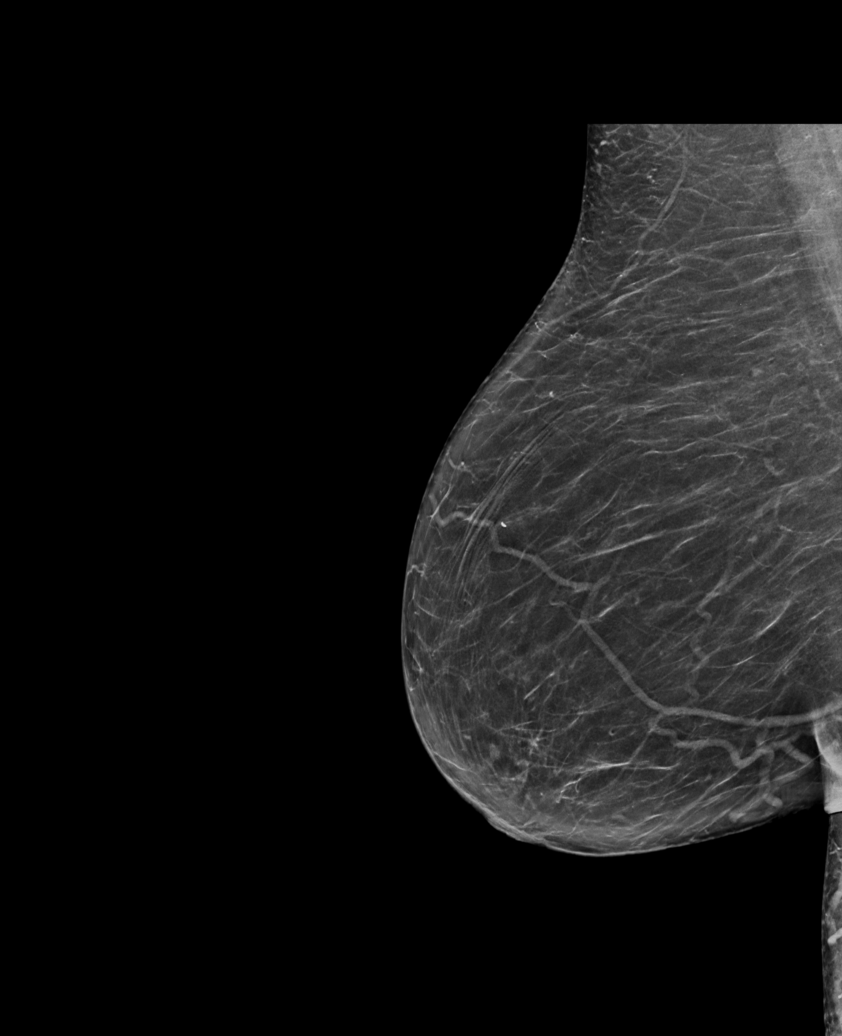

[L MLO synth-2D]
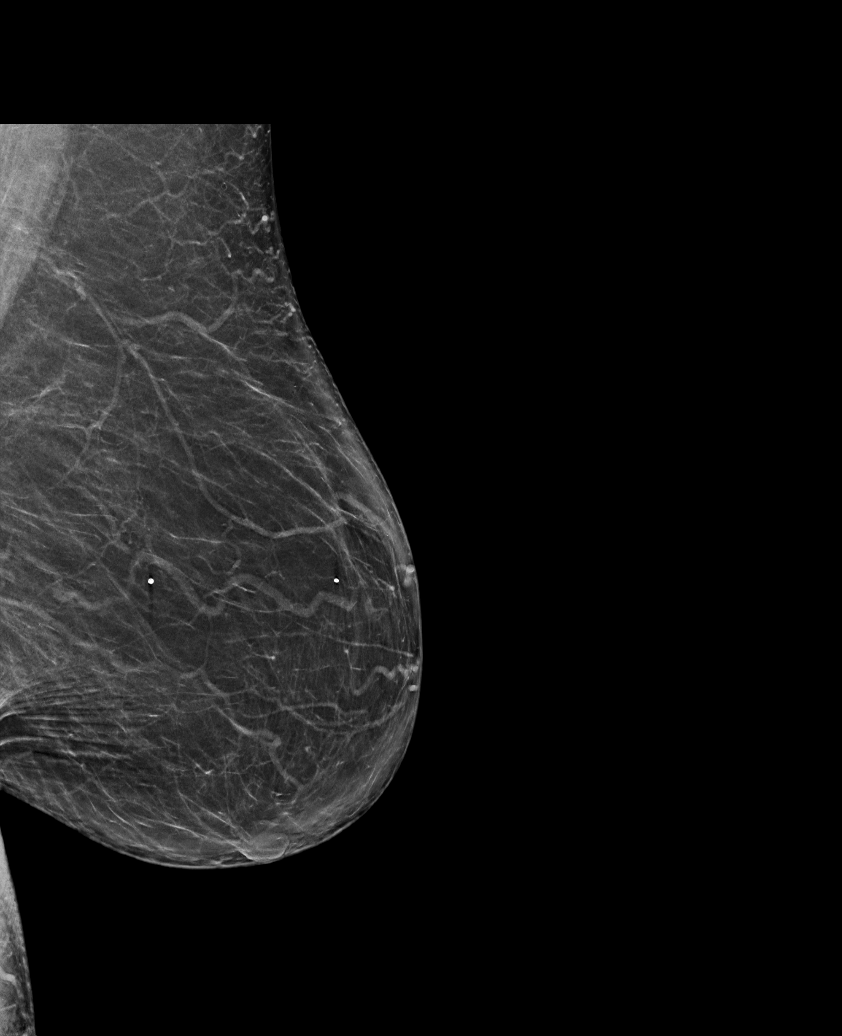

[R CC synth-2D (2 of 2)]
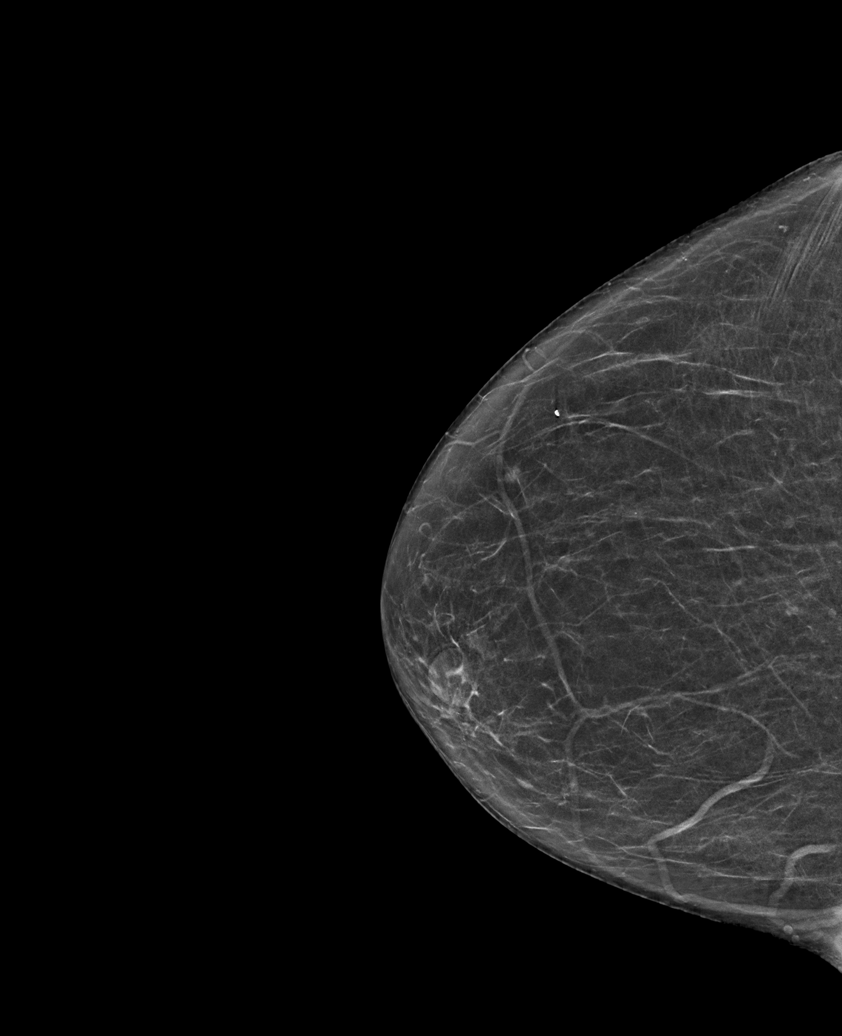

[6 of 36 positions shown; findings below may reference images not displayed]

ACR Breast Density Category b: There are scattered areas of
fibroglandular density.
FINDINGS: There are no findings suspicious for malignancy.
IMPRESSION: No mammographic evidence of malignancy. A result letter of this
screening mammogram will be mailed directly to the patient.

RECOMMENDATION:
Screening mammogram in one year. (Code:51-O-LD2)

BI-RADS CATEGORY  1: Negative.

## 2022-03-24 ENCOUNTER — Other Ambulatory Visit: Payer: Self-pay | Admitting: Internal Medicine

## 2022-03-24 DIAGNOSIS — R252 Cramp and spasm: Secondary | ICD-10-CM

## 2022-03-24 NOTE — Telephone Encounter (Signed)
Requested medication (s) are due for refill today: yes  Requested medication (s) are on the active medication list: yes  Last refill:  11/18/21 #30/0  Future visit scheduled: yes  Notes to clinic:  Unable to refill per protocol, cannot delegate.     Requested Prescriptions  Pending Prescriptions Disp Refills   methocarbamol (ROBAXIN) 500 MG tablet [Pharmacy Med Name: Methocarbamol 500 MG Oral Tablet] 30 tablet 0    Sig: TAKE 1 TABLET BY MOUTH ONCE DAILY AS NEEDED FOR MUSCLE SPASMS.     Not Delegated - Analgesics:  Muscle Relaxants Failed - 03/24/2022  4:04 PM      Failed - This refill cannot be delegated      Passed - Valid encounter within last 6 months    Recent Outpatient Visits           4 months ago Diabetes mellitus type 2 in obese [facility])   [place] Karle Plumber B, MD   8 months ago Diabetes mellitus type 2 in obese [facility])   [place], Deborah B, MD   1 year ago Diabetes mellitus type 2 in obese [facility])   [place], Deborah B, MD   1 year ago Diabetes mellitus type 2 in obese [facility])   [place], Deborah B, MD   1 year ago Essential hypertension   [place], [place], RPH-CPP       Future Appointments             In 1 month Wynetta Emery, Dalbert Batman, MD [place]

## 2022-03-25 ENCOUNTER — Other Ambulatory Visit: Payer: Self-pay

## 2022-03-29 ENCOUNTER — Other Ambulatory Visit: Payer: Self-pay

## 2022-04-22 ENCOUNTER — Other Ambulatory Visit: Payer: Self-pay | Admitting: Internal Medicine

## 2022-04-22 ENCOUNTER — Other Ambulatory Visit: Payer: Self-pay

## 2022-04-22 DIAGNOSIS — F410 Panic disorder [episodic paroxysmal anxiety] without agoraphobia: Secondary | ICD-10-CM

## 2022-04-23 ENCOUNTER — Other Ambulatory Visit: Payer: Self-pay | Admitting: Internal Medicine

## 2022-04-23 DIAGNOSIS — E1169 Type 2 diabetes mellitus with other specified complication: Secondary | ICD-10-CM

## 2022-04-23 DIAGNOSIS — R252 Cramp and spasm: Secondary | ICD-10-CM

## 2022-04-23 MED ORDER — ATORVASTATIN CALCIUM 20 MG PO TABS: 20.0000 mg | ORAL_TABLET | Freq: Every day | ORAL | 0 refills | Status: DC

## 2022-04-23 MED ORDER — METHOCARBAMOL 500 MG PO TABS: ORAL_TABLET | ORAL | 0 refills | Status: DC

## 2022-04-23 NOTE — Telephone Encounter (Signed)
Requested medication (s) are due for refill today:   Yes for atorvastatin, Provider to review Robaxin   Requested medication (s) are on the active medication list:   Yes for both  Future visit scheduled:   Yes in 3 wks   Last ordered: Atorvastatin 11/19/2021 #90, 1 refill;  Robaxin 03/24/2022 #30, 0 refills  Returned because lipid panel is due for atorvastatin and Robaxin is a non delegated refill.   Requested Prescriptions  Pending Prescriptions Disp Refills   atorvastatin (LIPITOR) 20 MG tablet 90 tablet 1    Sig: Take 1 tablet (20 mg total) by mouth daily.     Cardiovascular:  Antilipid - Statins Failed - 04/23/2022 12:19 PM      Failed - Lipid Panel in normal range within the last 12 months    Cholesterol, Total  Date Value Ref Range Status  03/17/2021 150 100 - 199 mg/dL Final   LDL Chol Calc (NIH)  Date Value Ref Range Status  03/17/2021 68 0 - 99 mg/dL Final   HDL  Date Value Ref Range Status  03/17/2021 48 >39 mg/dL Final   Triglycerides  Date Value Ref Range Status  03/17/2021 207 (H) 0 - 149 mg/dL Final         Passed - Patient is not pregnant      Passed - Valid encounter within last 12 months    Recent Outpatient Visits           5 months ago Diabetes mellitus type 2 in obese [facility])   [place] Karle Plumber B, MD   9 months ago Diabetes mellitus type 2 in obese [facility]   [place], Deborah B, MD   1 year ago Diabetes mellitus type 2 in obese [facility])   [place], Deborah B, MD   1 year ago Diabetes mellitus type 2 in obese [facility])   [place], Deborah B, MD   1 year ago Essential hypertension   [place], Jarome Matin, RPH-CPP       Future Appointments             In 3 weeks Ladell Pier, MD [place]              methocarbamol (ROBAXIN) 500 MG tablet 30 tablet 0     Not Delegated - Analgesics:  Muscle Relaxants Failed - 04/23/2022 12:19 PM      Failed - This refill cannot be delegated      Passed - Valid encounter within last 6 months    Recent Outpatient Visits           5 months ago Diabetes mellitus type 2 in obese [facility])   [place] Karle Plumber B, MD   9 months ago Diabetes mellitus type 2 in obese [facility])   [place], Deborah B, MD   1 year ago Diabetes mellitus type 2 in obese [facility])   [place], Deborah B, MD   1 year ago Diabetes mellitus type 2 in obese [facility])   [place], Deborah B, MD   1 year ago Essential hypertension   [place], RPH-CPP       Future Appointments  In 3 weeks Ladell Pier, MD [place]

## 2022-04-23 NOTE — Telephone Encounter (Signed)
Medication Refill - Medication: atorvastatin (LIPITOR) 20 MG tablet  methocarbamol (ROBAXIN) 500 MG tablet   Has the patient contacted their pharmacy? Yes.    Pt advised to reach out to her pcp   Preferred Pharmacy (with phone number or street name):  [place] - [address] Phone: [phone]  Fax: [phone]     Has the patient been seen for an appointment in the last year OR does the patient have an upcoming appointment? Yes.    Agent: Please be advised that RX refills may take up to 3 business days. We ask that you follow-up with your pharmacy.

## 2022-05-20 ENCOUNTER — Ambulatory Visit: Payer: BLUE CROSS/BLUE SHIELD | Attending: Internal Medicine | Admitting: Internal Medicine

## 2022-05-20 ENCOUNTER — Encounter: Payer: Self-pay | Admitting: Internal Medicine

## 2022-05-20 VITALS — BP 139/74 | HR 76 | Temp 98.6°F | Ht 64.0 in | Wt 178.0 lb

## 2022-05-20 DIAGNOSIS — E1159 Type 2 diabetes mellitus with other circulatory complications: Secondary | ICD-10-CM

## 2022-05-20 DIAGNOSIS — I152 Hypertension secondary to endocrine disorders: Secondary | ICD-10-CM

## 2022-05-20 DIAGNOSIS — E785 Hyperlipidemia, unspecified: Secondary | ICD-10-CM | POA: Diagnosis not present

## 2022-05-20 DIAGNOSIS — Z23 Encounter for immunization: Secondary | ICD-10-CM | POA: Diagnosis not present

## 2022-05-20 DIAGNOSIS — E1169 Type 2 diabetes mellitus with other specified complication: Secondary | ICD-10-CM

## 2022-05-20 DIAGNOSIS — E669 Obesity, unspecified: Secondary | ICD-10-CM

## 2022-05-20 DIAGNOSIS — Z6830 Body mass index (BMI) 30.0-30.9, adult: Secondary | ICD-10-CM

## 2022-05-20 LAB — POCT GLYCOSYLATED HEMOGLOBIN (HGB A1C): HbA1c, POC (controlled diabetic range): 7.2 % — AB (ref 0.0–7.0)

## 2022-05-20 LAB — GLUCOSE, POCT (MANUAL RESULT ENTRY): POC Glucose: 84 mg/dl (ref 70–99)

## 2022-05-20 MED ORDER — GABAPENTIN 300 MG PO CAPS: 300.0000 mg | ORAL_CAPSULE | Freq: Every day | ORAL | 3 refills | Status: DC

## 2022-05-20 MED ORDER — AMLODIPINE BESYLATE 10 MG PO TABS: ORAL_TABLET | ORAL | 1 refills | Status: DC

## 2022-05-20 MED ORDER — SPIRONOLACTONE 25 MG PO TABS: ORAL_TABLET | ORAL | 1 refills | Status: DC

## 2022-05-20 MED ORDER — HYDROCHLOROTHIAZIDE 25 MG PO TABS: 25.0000 mg | ORAL_TABLET | Freq: Every day | ORAL | 1 refills | Status: DC

## 2022-05-20 MED ORDER — METFORMIN HCL ER 500 MG PO TB24: 1000.0000 mg | ORAL_TABLET | Freq: Every day | ORAL | 0 refills | Status: DC

## 2022-05-20 MED ORDER — ATORVASTATIN CALCIUM 20 MG PO TABS: 20.0000 mg | ORAL_TABLET | Freq: Every day | ORAL | 3 refills | Status: DC

## 2022-05-20 MED ORDER — GLIPIZIDE 5 MG PO TABS: 5.0000 mg | ORAL_TABLET | Freq: Every day | ORAL | 4 refills | Status: DC
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
## 2022-05-20 NOTE — Patient Instructions (Signed)
I have sent refills on your medications to your pharmacy.   Please return to the lab next week to have your blood test done.

## 2022-05-27 ENCOUNTER — Ambulatory Visit: Payer: BLUE CROSS/BLUE SHIELD | Attending: Internal Medicine

## 2022-05-27 DIAGNOSIS — E669 Obesity, unspecified: Secondary | ICD-10-CM

## 2022-05-28 LAB — COMPREHENSIVE METABOLIC PANEL
ALT: 34 IU/L — ABNORMAL HIGH (ref 0–32)
AST: 24 IU/L (ref 0–40)
Albumin/Globulin Ratio: 1.4 (ref 1.2–2.2)
Albumin: 4.3 g/dL (ref 3.8–4.9)
Alkaline Phosphatase: 96 IU/L (ref 44–121)
BUN/Creatinine Ratio: 21 (ref 12–28)
BUN: 22 mg/dL (ref 8–27)
Bilirubin Total: 0.2 mg/dL (ref 0.0–1.2)
CO2: 26 mmol/L (ref 20–29)
Calcium: 9.8 mg/dL (ref 8.7–10.3)
Chloride: 98 mmol/L (ref 96–106)
Creatinine, Ser: 1.07 mg/dL — ABNORMAL HIGH (ref 0.57–1.00)
Globulin, Total: 3.1 g/dL (ref 1.5–4.5)
Glucose: 98 mg/dL (ref 70–99)
Potassium: 3.5 mmol/L (ref 3.5–5.2)
Sodium: 139 mmol/L (ref 134–144)
Total Protein: 7.4 g/dL (ref 6.0–8.5)
eGFR: 59 mL/min/{1.73_m2} — ABNORMAL LOW (ref >59)

## 2022-05-28 LAB — LIPID PANEL
Chol/HDL Ratio: 2.7 ratio (ref 0.0–4.4)
Cholesterol, Total: 143 mg/dL (ref 100–199)
HDL: 53 mg/dL (ref >39)
LDL Chol Calc (NIH): 71 mg/dL (ref 0–99)
Triglycerides: 104 mg/dL (ref 0–149)
VLDL Cholesterol Cal: 19 mg/dL (ref 5–40)

## 2022-05-28 LAB — CBC
Hematocrit: 31 % — ABNORMAL LOW (ref 34.0–46.6)
Hemoglobin: 9.9 g/dL — ABNORMAL LOW (ref 11.1–15.9)
MCH: 27.5 pg (ref 26.6–33.0)
MCHC: 31.9 g/dL (ref 31.5–35.7)
MCV: 86 fL (ref 79–97)
Platelets: 258 10*3/uL (ref 150–450)
RBC: 3.6 x10E6/uL — ABNORMAL LOW (ref 3.77–5.28)
RDW: 14.3 % (ref 11.7–15.4)
WBC: 7.7 10*3/uL (ref 3.4–10.8)

## 2022-05-28 NOTE — Progress Notes (Signed)
Let patient know that her kidney function is slightly decreased compared to 14 months ago.  We will keep an eye on this for now.  Liver function tests good.  She has a mild but stable anemia.  Cholesterol levels are normal.

## 2022-05-31 ENCOUNTER — Other Ambulatory Visit: Payer: Self-pay | Admitting: Internal Medicine

## 2022-05-31 DIAGNOSIS — R252 Cramp and spasm: Secondary | ICD-10-CM

## 2022-08-31 ENCOUNTER — Ambulatory Visit: Payer: BLUE CROSS/BLUE SHIELD | Admitting: Podiatry

## 2022-09-01 ENCOUNTER — Encounter: Payer: Medicaid Other | Admitting: Podiatry

## 2022-09-01 ENCOUNTER — Ambulatory Visit: Payer: Self-pay | Admitting: Podiatry

## 2022-09-02 NOTE — Progress Notes (Signed)
This encounter was created in error - please disregard.

## 2022-09-16 ENCOUNTER — Other Ambulatory Visit: Payer: Self-pay | Admitting: Internal Medicine

## 2022-09-16 DIAGNOSIS — F410 Panic disorder [episodic paroxysmal anxiety] without agoraphobia: Secondary | ICD-10-CM

## 2022-09-16 NOTE — Telephone Encounter (Signed)
Requested Prescriptions  Pending Prescriptions Disp Refills   sertraline (ZOLOFT) 50 MG tablet [Pharmacy Med Name: Sertraline HCl 50 MG Oral Tablet] 90 tablet 0    Sig: TAKE 1 TABLET BY MOUTH ONCE DAILY . APPOINTMENT REQUIRED FOR FUTURE REFILLS     Psychiatry:  Antidepressants - SSRI - sertraline Failed - 09/16/2022  1:34 PM      Failed - ALT in normal range and within 360 days    ALT  Date Value Ref Range Status  05/27/2022 34 (H) 0 - 32 IU/L Final         Passed - AST in normal range and within 360 days    AST  Date Value Ref Range Status  05/27/2022 24 0 - 40 IU/L Final         Passed - Completed PHQ-2 or PHQ-9 in the last 360 days      Passed - Valid encounter within last 6 months    Recent Outpatient Visits           3 months ago Diabetes mellitus type 2 in obese [facility])   [place] [facility] Jonah Blue B, MD   10 months ago Diabetes mellitus type 2 in obese [facility])   [place] [facility] Jonah Blue B, MD   1 year ago Diabetes mellitus type 2 in obese [facility])   [place] [facility] Jonah Blue B, MD   1 year ago Diabetes mellitus type 2 in obese [facility])   [place] [facility] Jonah Blue B, MD   1 year ago Diabetes mellitus type 2 in obese [facility])   [place] [facility] Marcine Matar, MD       Future Appointments             In 6 days Marcine Matar, MD [facility]

## 2022-09-22 ENCOUNTER — Ambulatory Visit: Payer: BLUE CROSS/BLUE SHIELD | Attending: Internal Medicine | Admitting: Internal Medicine

## 2022-09-22 ENCOUNTER — Encounter: Payer: Self-pay | Admitting: Internal Medicine

## 2022-09-22 VITALS — BP 148/80 | HR 65 | Temp 98.1°F | Ht 64.0 in | Wt 189.0 lb

## 2022-09-22 DIAGNOSIS — I10 Essential (primary) hypertension: Secondary | ICD-10-CM | POA: Insufficient documentation

## 2022-09-22 DIAGNOSIS — L309 Dermatitis, unspecified: Secondary | ICD-10-CM | POA: Diagnosis not present

## 2022-09-22 DIAGNOSIS — E1159 Type 2 diabetes mellitus with other circulatory complications: Secondary | ICD-10-CM

## 2022-09-22 DIAGNOSIS — Z6832 Body mass index (BMI) 32.0-32.9, adult: Secondary | ICD-10-CM | POA: Insufficient documentation

## 2022-09-22 DIAGNOSIS — Z713 Dietary counseling and surveillance: Secondary | ICD-10-CM | POA: Diagnosis not present

## 2022-09-22 DIAGNOSIS — E119 Type 2 diabetes mellitus without complications: Secondary | ICD-10-CM | POA: Insufficient documentation

## 2022-09-22 DIAGNOSIS — R252 Cramp and spasm: Secondary | ICD-10-CM

## 2022-09-22 DIAGNOSIS — F419 Anxiety disorder, unspecified: Secondary | ICD-10-CM | POA: Diagnosis not present

## 2022-09-22 DIAGNOSIS — Z8673 Personal history of transient ischemic attack (TIA), and cerebral infarction without residual deficits: Secondary | ICD-10-CM | POA: Diagnosis not present

## 2022-09-22 DIAGNOSIS — E669 Obesity, unspecified: Secondary | ICD-10-CM | POA: Insufficient documentation

## 2022-09-22 DIAGNOSIS — Z7984 Long term (current) use of oral hypoglycemic drugs: Secondary | ICD-10-CM

## 2022-09-22 DIAGNOSIS — E1169 Type 2 diabetes mellitus with other specified complication: Secondary | ICD-10-CM | POA: Diagnosis not present

## 2022-09-22 DIAGNOSIS — I152 Hypertension secondary to endocrine disorders: Secondary | ICD-10-CM

## 2022-09-22 DIAGNOSIS — E785 Hyperlipidemia, unspecified: Secondary | ICD-10-CM | POA: Insufficient documentation

## 2022-09-22 DIAGNOSIS — M62838 Other muscle spasm: Secondary | ICD-10-CM | POA: Diagnosis not present

## 2022-09-22 LAB — POCT GLYCOSYLATED HEMOGLOBIN (HGB A1C): HbA1c, POC (controlled diabetic range): 7.3 % — AB (ref 0.0–7.0)

## 2022-09-22 LAB — GLUCOSE, POCT (MANUAL RESULT ENTRY): POC Glucose: 96 mg/dl (ref 70–99)

## 2022-09-22 MED ORDER — ATORVASTATIN CALCIUM 20 MG PO TABS: 20.0000 mg | ORAL_TABLET | Freq: Every day | ORAL | 6 refills | Status: DC

## 2022-09-22 MED ORDER — METHOCARBAMOL 500 MG PO TABS
ORAL_TABLET | ORAL | 0 refills | Status: DC
Start: 2022-09-22 — End: 2023-01-17

## 2022-09-22 MED ORDER — GABAPENTIN 300 MG PO CAPS: 300.0000 mg | ORAL_CAPSULE | Freq: Every day | ORAL | 3 refills | Status: DC

## 2022-09-22 MED ORDER — SPIRONOLACTONE 25 MG PO TABS: 25.0000 mg | ORAL_TABLET | Freq: Every day | ORAL | 1 refills | Status: DC

## 2022-09-22 MED ORDER — HYDROCHLOROTHIAZIDE 25 MG PO TABS: 25.0000 mg | ORAL_TABLET | Freq: Every day | ORAL | 1 refills | Status: DC

## 2022-09-22 MED ORDER — AMLODIPINE BESYLATE 10 MG PO TABS: ORAL_TABLET | ORAL | 1 refills | Status: DC

## 2022-09-22 MED ORDER — TRIAMCINOLONE ACETONIDE 0.1 % EX OINT
1.0000 | TOPICAL_OINTMENT | Freq: Two times a day (BID) | CUTANEOUS | 1 refills | Status: DC
Start: 2022-09-22 — End: 2023-07-28
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
## 2022-09-22 NOTE — Patient Instructions (Signed)
Increase Spironolactone to 25 mg ONE Tablet daily.  After being on the full tablet for 1 week, please return to the lab for recheck for blood test for kidney function and potassium level.

## 2022-09-23 ENCOUNTER — Ambulatory Visit: Payer: Self-pay | Admitting: Internal Medicine

## 2022-09-23 LAB — MICROALBUMIN / CREATININE URINE RATIO
Creatinine, Urine: 167.9 mg/dL
Microalb/Creat Ratio: 5 mg/g creat (ref 0–29)
Microalbumin, Urine: 9.2 ug/mL

## 2022-09-29 ENCOUNTER — Ambulatory Visit: Payer: BLUE CROSS/BLUE SHIELD | Attending: Family Medicine

## 2022-09-30 LAB — BASIC METABOLIC PANEL
BUN/Creatinine Ratio: 24 (ref 12–28)
BUN: 21 mg/dL (ref 8–27)
CO2: 25 mmol/L (ref 20–29)
Calcium: 10 mg/dL (ref 8.7–10.3)
Chloride: 100 mmol/L (ref 96–106)
Creatinine, Ser: 0.89 mg/dL (ref 0.57–1.00)
Glucose: 64 mg/dL — ABNORMAL LOW (ref 70–99)
Potassium: 3.7 mmol/L (ref 3.5–5.2)
Sodium: 139 mmol/L (ref 134–144)
eGFR: 74 mL/min/{1.73_m2} (ref >59)

## 2022-10-04 ENCOUNTER — Ambulatory Visit: Payer: Self-pay | Admitting: *Deleted

## 2022-10-04 NOTE — Telephone Encounter (Signed)
  Chief Complaint: Sig clarification Symptoms:  Frequency:  Pertinent Negatives: Patient denies  Disposition: [] ED /[] Urgent Care (no appt availability in office) / [] Appointment(In office/virtual)/ []  [place] [facility]/ [] Home Care/ [] Refused Recommended Disposition /[] [place] [facility]/ [x]  Follow-up with PCP Additional Notes: Returned call to [employer]. Pharmacy states that sig states that pt is to take 1 tablet daily. Sig also states that pt should take 1/2 table daily. Please clarify.    Summary: Clairification of sig   Calling to receive clarification of sig for spironolactone (ALDACTONE) 25 MG tablet [[id number]]     Reason for Disposition  [1] Pharmacy calling with prescription question AND [2] triager unable to answer question  Answer Assessment - Initial Assessment Questions 1. NAME of MEDICINE: "What medicine(s) are you calling about?"     Aldactone 2. QUESTION: "What is your question?" (e.g., double dose of medicine, side effect)     Sig states that pt should take both 1 tablet daily and 1/2 tablet daily  Protocols used: Medication Question Call-A-AH

## 2022-10-04 NOTE — Telephone Encounter (Signed)
Call returned to [facility] pharmacy Advised per pcp note on the 09/22/2022 patient should have been on spironolactone (ALDACTONE) 25 MG  After being on the increased dose of spironolactone for 1 week, she should return to the lab to have chemistry checked.  Spoke with patient . Verified name & DOB    Patient voiced that she just started taken 25 mg of ALDACTONE on 10/03/2022. Patient scheduled for repeat chemistry on 04/12/2022

## 2022-10-04 NOTE — Telephone Encounter (Signed)
Attempted to reach pharmacy, closed until 2.

## 2022-10-08 ENCOUNTER — Telehealth: Payer: Self-pay | Admitting: Internal Medicine

## 2022-10-08 NOTE — Telephone Encounter (Signed)
Copied from CRM [id number]. Topic: General - Inquiry >> Oct 07, 2022  4:16 PM Haroldine Laws wrote: Reason for CRM: pt is coming in for labs July 1st and she wants to know if Dr. Laural Benes will write her a note saying what medications she is taking and why.  She said this is for her vacation  74  [phone]

## 2022-10-08 NOTE — Telephone Encounter (Signed)
Called & spoke to the patient. Verified name & DOB. Informed patient that a medication list and description has been printed and left at the front desk for pick- up. Patient expressed verbal understanding.

## 2022-10-11 ENCOUNTER — Ambulatory Visit: Payer: BLUE CROSS/BLUE SHIELD | Attending: Internal Medicine

## 2022-10-11 ENCOUNTER — Other Ambulatory Visit: Payer: Self-pay

## 2022-10-11 DIAGNOSIS — I152 Hypertension secondary to endocrine disorders: Secondary | ICD-10-CM

## 2022-10-11 DIAGNOSIS — E1159 Type 2 diabetes mellitus with other circulatory complications: Secondary | ICD-10-CM

## 2022-10-11 DIAGNOSIS — E1169 Type 2 diabetes mellitus with other specified complication: Secondary | ICD-10-CM

## 2022-10-11 DIAGNOSIS — E669 Obesity, unspecified: Secondary | ICD-10-CM

## 2022-10-12 LAB — COMPREHENSIVE METABOLIC PANEL
ALT: 23 IU/L (ref 0–32)
AST: 20 IU/L (ref 0–40)
Albumin: 4.3 g/dL (ref 3.8–4.9)
Alkaline Phosphatase: 97 IU/L (ref 44–121)
BUN/Creatinine Ratio: 17 (ref 12–28)
BUN: 16 mg/dL (ref 8–27)
Bilirubin Total: <0.2 mg/dL (ref 0.0–1.2)
CO2: 27 mmol/L (ref 20–29)
Calcium: 9.9 mg/dL (ref 8.7–10.3)
Chloride: 100 mmol/L (ref 96–106)
Creatinine, Ser: 0.96 mg/dL (ref 0.57–1.00)
Globulin, Total: 3.2 g/dL (ref 1.5–4.5)
Glucose: 129 mg/dL — ABNORMAL HIGH (ref 70–99)
Potassium: 4 mmol/L (ref 3.5–5.2)
Sodium: 139 mmol/L (ref 134–144)
Total Protein: 7.5 g/dL (ref 6.0–8.5)
eGFR: 68 mL/min/{1.73_m2} (ref >59)

## 2022-11-20 ENCOUNTER — Other Ambulatory Visit: Payer: Self-pay | Admitting: Internal Medicine

## 2022-11-20 DIAGNOSIS — F410 Panic disorder [episodic paroxysmal anxiety] without agoraphobia: Secondary | ICD-10-CM

## 2022-11-22 NOTE — Telephone Encounter (Signed)
Requested Prescriptions  Refused Prescriptions Disp Refills   sertraline (ZOLOFT) 50 MG tablet [Pharmacy Med Name: Sertraline HCl 50 MG Oral Tablet] 30 tablet 0    Sig: TAKE 1 TABLET BY MOUTH ONCE DAILY . APPOINTMENT REQUIRED FOR FUTURE REFILLS     Psychiatry:  Antidepressants - SSRI - sertraline Passed - 11/20/2022 11:32 AM      Passed - AST in normal range and within 360 days    AST  Date Value Ref Range Status  10/11/2022 20 0 - 40 IU/L Final         Passed - ALT in normal range and within 360 days    ALT  Date Value Ref Range Status  10/11/2022 23 0 - 32 IU/L Final         Passed - Completed PHQ-2 or PHQ-9 in the last 360 days      Passed - Valid encounter within last 6 months    Recent Outpatient Visits           2 months ago Type 2 diabetes mellitus with obesity (HCC)   [place] [facility] Jonah Blue B, MD   6 months ago Diabetes mellitus type 2 in obese [facility])   [place] [facility] Jonah Blue B, MD   1 year ago Diabetes mellitus type 2 in obese [facility])   [place] [facility] Jonah Blue B, MD   1 year ago Diabetes mellitus type 2 in obese [facility])   [place] [facility] Jonah Blue B, MD   1 year ago Diabetes mellitus type 2 in obese [facility])   [place] [facility] Marcine Matar, MD       Future Appointments             In 2 months Laural Benes, Binnie Rail, MD [facility]

## 2022-12-19 ENCOUNTER — Other Ambulatory Visit: Payer: Self-pay | Admitting: Internal Medicine

## 2022-12-19 DIAGNOSIS — F410 Panic disorder [episodic paroxysmal anxiety] without agoraphobia: Secondary | ICD-10-CM

## 2022-12-19 DIAGNOSIS — E1169 Type 2 diabetes mellitus with other specified complication: Secondary | ICD-10-CM

## 2023-01-14 ENCOUNTER — Other Ambulatory Visit: Payer: Self-pay | Admitting: Internal Medicine

## 2023-01-14 DIAGNOSIS — Z1212 Encounter for screening for malignant neoplasm of rectum: Secondary | ICD-10-CM

## 2023-01-14 DIAGNOSIS — Z1211 Encounter for screening for malignant neoplasm of colon: Secondary | ICD-10-CM

## 2023-01-17 ENCOUNTER — Other Ambulatory Visit: Payer: Self-pay | Admitting: Internal Medicine

## 2023-01-17 DIAGNOSIS — R252 Cramp and spasm: Secondary | ICD-10-CM

## 2023-01-25 ENCOUNTER — Ambulatory Visit: Payer: Self-pay | Admitting: Internal Medicine

## 2023-01-26 ENCOUNTER — Other Ambulatory Visit: Payer: Self-pay | Admitting: Internal Medicine

## 2023-01-26 DIAGNOSIS — Z1231 Encounter for screening mammogram for malignant neoplasm of breast: Secondary | ICD-10-CM

## 2023-01-26 LAB — COLOGUARD

## 2023-02-15 ENCOUNTER — Other Ambulatory Visit: Payer: Self-pay | Admitting: Internal Medicine

## 2023-02-15 DIAGNOSIS — E1169 Type 2 diabetes mellitus with other specified complication: Secondary | ICD-10-CM

## 2023-02-15 LAB — COLOGUARD: COLOGUARD: NEGATIVE

## 2023-02-22 ENCOUNTER — Ambulatory Visit: Payer: BLUE CROSS/BLUE SHIELD

## 2023-02-24 ENCOUNTER — Ambulatory Visit: Payer: BLUE CROSS/BLUE SHIELD

## 2023-02-28 ENCOUNTER — Other Ambulatory Visit: Payer: Self-pay | Admitting: Internal Medicine

## 2023-02-28 DIAGNOSIS — I152 Hypertension secondary to endocrine disorders: Secondary | ICD-10-CM

## 2023-03-01 NOTE — Telephone Encounter (Signed)
Requested Prescriptions  Pending Prescriptions Disp Refills   amLODipine (NORVASC) 10 MG tablet [Pharmacy Med Name: amLODIPine Besylate 10 MG Oral Tablet] 90 tablet 0    Sig: TAKE 1 TABLET BY MOUTH ONCE DAILY DOSE INCREASE     Cardiovascular: Calcium Channel Blockers 2 Failed - 02/28/2023  2:52 PM      Failed - Last BP in normal range    BP Readings from Last 1 Encounters:  09/22/22 (!) 148/80         Passed - Last Heart Rate in normal range    Pulse Readings from Last 1 Encounters:  09/22/22 65         Passed - Valid encounter within last 6 months    Recent Outpatient Visits           5 months ago Type 2 diabetes mellitus with obesity (HCC)   [place] [facility] [place] Jonah Blue B, MD   9 months ago Diabetes mellitus type 2 in obese [facility])   [place] [facility] [place]. [facility] Marcine Matar, MD   1 year ago Diabetes mellitus type 2 in obese [facility])   [place] [facility] [place]. [facility] Marcine Matar, MD   1 year ago Diabetes mellitus type 2 in obese [facility])   [place] [facility] [place]. [facility] Marcine Matar, MD   1 year ago Diabetes mellitus type 2 in obese [facility])   [place] [facility] - [facility] Of . [facility] Marcine Matar, MD       Future Appointments             In 4 weeks Marcine Matar, MD [facility] [place] - [facility] Dept Of Eligha Bridegroom. [facility]

## 2023-03-03 ENCOUNTER — Telehealth: Payer: Self-pay

## 2023-03-03 NOTE — Telephone Encounter (Signed)
Pt given cologuard results per notes of Dr.Johnson on 02/16/23. Pt verbalized understanding.   Marcine Matar, MD 02/16/2023  9:12 AM EST     Cologuard test was negative.  She will be due again in 3 years.

## 2023-03-24 LAB — HM MAMMOGRAPHY

## 2023-03-29 ENCOUNTER — Ambulatory Visit: Payer: BLUE CROSS/BLUE SHIELD | Attending: Internal Medicine | Admitting: Internal Medicine

## 2023-03-29 VITALS — BP 132/69 | HR 67 | Ht 64.0 in | Wt 189.0 lb

## 2023-03-29 DIAGNOSIS — E669 Obesity, unspecified: Secondary | ICD-10-CM

## 2023-03-29 DIAGNOSIS — Z2821 Immunization not carried out because of patient refusal: Secondary | ICD-10-CM

## 2023-03-29 DIAGNOSIS — R252 Cramp and spasm: Secondary | ICD-10-CM | POA: Diagnosis not present

## 2023-03-29 DIAGNOSIS — F410 Panic disorder [episodic paroxysmal anxiety] without agoraphobia: Secondary | ICD-10-CM

## 2023-03-29 DIAGNOSIS — E1159 Type 2 diabetes mellitus with other circulatory complications: Secondary | ICD-10-CM

## 2023-03-29 DIAGNOSIS — E1169 Type 2 diabetes mellitus with other specified complication: Secondary | ICD-10-CM | POA: Diagnosis not present

## 2023-03-29 DIAGNOSIS — Z7984 Long term (current) use of oral hypoglycemic drugs: Secondary | ICD-10-CM

## 2023-03-29 DIAGNOSIS — I152 Hypertension secondary to endocrine disorders: Secondary | ICD-10-CM

## 2023-03-29 DIAGNOSIS — E785 Hyperlipidemia, unspecified: Secondary | ICD-10-CM

## 2023-03-29 LAB — GLUCOSE, POCT (MANUAL RESULT ENTRY): POC Glucose: 82 mg/dL (ref 70–99)

## 2023-03-29 LAB — POCT GLYCOSYLATED HEMOGLOBIN (HGB A1C): HbA1c, POC (controlled diabetic range): 7.1 % — AB (ref 0.0–7.0)

## 2023-03-29 MED ORDER — HYDROCHLOROTHIAZIDE 25 MG PO TABS
25.0000 mg | ORAL_TABLET | Freq: Every day | ORAL | 1 refills | Status: DC
Start: 2023-03-29 — End: 2024-01-10

## 2023-03-29 MED ORDER — ATORVASTATIN CALCIUM 20 MG PO TABS
20.0000 mg | ORAL_TABLET | Freq: Every day | ORAL | 1 refills | Status: DC
Start: 2023-03-29 — End: 2023-11-28

## 2023-03-29 MED ORDER — SPIRONOLACTONE 25 MG PO TABS: 25.0000 mg | ORAL_TABLET | Freq: Every day | ORAL | 1 refills | Status: DC

## 2023-03-29 MED ORDER — GLIPIZIDE 5 MG PO TABS
5.0000 mg | ORAL_TABLET | Freq: Every day | ORAL | 1 refills | Status: DC
Start: 2023-03-29 — End: 2023-11-28

## 2023-03-29 MED ORDER — METHOCARBAMOL 500 MG PO TABS: ORAL_TABLET | ORAL | 1 refills | Status: DC

## 2023-03-29 MED ORDER — GABAPENTIN 300 MG PO CAPS: 300.0000 mg | ORAL_CAPSULE | Freq: Every day | ORAL | 1 refills | Status: DC

## 2023-03-29 MED ORDER — SERTRALINE HCL 50 MG PO TABS: ORAL_TABLET | ORAL | 1 refills | Status: DC
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
## 2023-03-30 ENCOUNTER — Encounter: Payer: Self-pay | Admitting: Internal Medicine

## 2023-04-27 LAB — HM DIABETES EYE EXAM

## 2023-05-14 DIAGNOSIS — Z419 Encounter for procedure for purposes other than remedying health state, unspecified: Secondary | ICD-10-CM | POA: Diagnosis not present

## 2023-06-11 DIAGNOSIS — Z419 Encounter for procedure for purposes other than remedying health state, unspecified: Secondary | ICD-10-CM | POA: Diagnosis not present

## 2023-07-04 ENCOUNTER — Other Ambulatory Visit: Payer: Self-pay | Admitting: Internal Medicine

## 2023-07-04 DIAGNOSIS — F410 Panic disorder [episodic paroxysmal anxiety] without agoraphobia: Secondary | ICD-10-CM

## 2023-07-04 NOTE — Telephone Encounter (Unsigned)
 Copied from CRM [id number]. Topic: Clinical - Medication Refill >> Jul 04, 2023 12:13 PM Erika Nielsen wrote: Most Recent Primary Care Visit:  Provider: Jonah Blue B  Department: [facility]  Visit Type: OFFICE VISIT  Date: 03/29/2023  Medication: sertraline (ZOLOFT) 50 MG tablet  Has the patient contacted their pharmacy? No  Is this the correct pharmacy for this prescription? Yes [id number] - [place] - [address] [place] ([place]) [place] [address] Phone: [phone] Fax: [phone]   Has the prescription been filled recently? No  Is the patient out of the medication? Yes  Has the patient been seen for an appointment in the last year OR does the patient have an upcoming appointment? Yes  Can we respond through MyChart? No. Contact patient by phone at [phone]  Agent: Please be advised that Rx refills may take up to 3 business days. We ask that you follow-up with your pharmacy.

## 2023-07-05 NOTE — Telephone Encounter (Signed)
 Requested Prescriptions  Refused Prescriptions Disp Refills   sertraline (ZOLOFT) 50 MG tablet 90 tablet 1    Sig: TAKE 1 TABLET BY MOUTH ONCE DAILY . APPOINTMENT REQUIRED FOR FUTURE REFILLS     Psychiatry:  Antidepressants - SSRI - sertraline Passed - 07/05/2023  5:17 PM      Passed - AST in normal range and within 360 days    AST  Date Value Ref Range Status  10/11/2022 20 0 - 40 IU/L Final         Passed - ALT in normal range and within 360 days    ALT  Date Value Ref Range Status  10/11/2022 23 0 - 32 IU/L Final         Passed - Completed PHQ-2 or PHQ-9 in the last 360 days      Passed - Valid encounter within last 6 months    Recent Outpatient Visits           3 months ago Type 2 diabetes mellitus with obesity (HCC)   [facility] [place]. [facility] Jonah Blue B, MD   9 months ago Type 2 diabetes mellitus with obesity [facility])   [facility] Of [place]. [facility] Marcine Matar, MD   1 year ago Diabetes mellitus type 2 in obese [facility])   [facility] - A Dept Of [place]. [facility] Marcine Matar, MD   1 year ago Diabetes mellitus type 2 in obese [facility])   [facility] - A Dept Of [place]. [facility] Marcine Matar, MD   1 year ago Diabetes mellitus type 2 in obese [facility])   [facility] - A Dept Of . [facility] Marcine Matar, MD       Future Appointments             In 3 weeks Marcine Matar, MD [facility] - A Dept Of [facility]. [facility]

## 2023-07-05 NOTE — Telephone Encounter (Signed)
 Called pharmacy - pt has a refill available. Pharmacy will prepare refill.

## 2023-07-23 DIAGNOSIS — Z419 Encounter for procedure for purposes other than remedying health state, unspecified: Secondary | ICD-10-CM | POA: Diagnosis not present

## 2023-07-28 ENCOUNTER — Ambulatory Visit: Payer: BLUE CROSS/BLUE SHIELD | Attending: Internal Medicine | Admitting: Internal Medicine

## 2023-07-28 ENCOUNTER — Encounter: Payer: Self-pay | Admitting: Internal Medicine

## 2023-07-28 VITALS — BP 146/76 | HR 70 | Temp 98.5°F | Ht 64.0 in | Wt 182.0 lb

## 2023-07-28 DIAGNOSIS — I10 Essential (primary) hypertension: Secondary | ICD-10-CM

## 2023-07-28 DIAGNOSIS — E1169 Type 2 diabetes mellitus with other specified complication: Secondary | ICD-10-CM

## 2023-07-28 DIAGNOSIS — Z6831 Body mass index (BMI) 31.0-31.9, adult: Secondary | ICD-10-CM | POA: Diagnosis not present

## 2023-07-28 DIAGNOSIS — E669 Obesity, unspecified: Secondary | ICD-10-CM

## 2023-07-28 DIAGNOSIS — L309 Dermatitis, unspecified: Secondary | ICD-10-CM

## 2023-07-28 DIAGNOSIS — E119 Type 2 diabetes mellitus without complications: Secondary | ICD-10-CM | POA: Diagnosis not present

## 2023-07-28 DIAGNOSIS — Z7984 Long term (current) use of oral hypoglycemic drugs: Secondary | ICD-10-CM

## 2023-07-28 DIAGNOSIS — E1159 Type 2 diabetes mellitus with other circulatory complications: Secondary | ICD-10-CM

## 2023-07-28 DIAGNOSIS — E785 Hyperlipidemia, unspecified: Secondary | ICD-10-CM | POA: Diagnosis not present

## 2023-07-28 DIAGNOSIS — R252 Cramp and spasm: Secondary | ICD-10-CM

## 2023-07-28 LAB — GLUCOSE, POCT (MANUAL RESULT ENTRY): POC Glucose: 113 mg/dL — AB (ref 70–99)

## 2023-07-28 LAB — POCT GLYCOSYLATED HEMOGLOBIN (HGB A1C): HbA1c, POC (controlled diabetic range): 7 % (ref 0.0–7.0)

## 2023-07-28 MED ORDER — METHOCARBAMOL 500 MG PO TABS: ORAL_TABLET | ORAL | 1 refills | Status: DC

## 2023-07-28 MED ORDER — AMLODIPINE BESYLATE 10 MG PO TABS: ORAL_TABLET | ORAL | 1 refills | Status: DC

## 2023-07-28 MED ORDER — METFORMIN HCL ER 500 MG PO TB24: ORAL_TABLET | ORAL | 2 refills | Status: DC

## 2023-07-28 MED ORDER — TRIAMCINOLONE ACETONIDE 0.1 % EX OINT
1.0000 | TOPICAL_OINTMENT | Freq: Two times a day (BID) | CUTANEOUS | 1 refills | Status: AC
Start: 2023-07-28 — End: ?
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
## 2023-07-28 NOTE — Patient Instructions (Signed)
 VISIT SUMMARY:  Erika Nielsen, you came in today for a follow-up visit to manage your diabetes, hypertension, and hyperlipidemia. Your A1c has slightly improved, but your morning blood sugar levels are still high. Your blood pressure was elevated, and you have been out of your amlodipine for a week. Your hyperlipidemia and eczema are being managed well with your current medications.  YOUR PLAN:  -TYPE 2 DIABETES MELLITUS: Type 2 Diabetes Mellitus is a condition where your body does not use insulin properly, leading to high blood sugar levels. Your A1c has decreased to 7.0%, but your morning glucose levels are still high. We will increase your metformin to 1500 mg daily, taking two tablets in the morning and one in the evening. Continue taking glipizide at 5 mg once daily. Please carry snacks or glucose tablets to manage any episodes of low blood sugar. An updated metformin prescription has been sent to your pharmacy.  -HYPERTENSION: Hypertension is high blood pressure, which can lead to serious health problems if not managed. Your blood pressure was slightly elevated at 147/78 mmHg, and you have missed your amlodipine for a week. We have sent a refill for amlodipine to your pharmacy. Continue taking hydrochlorothiazide and spironolactone as prescribed.  -HYPERLIPIDEMIA: Hyperlipidemia is having high levels of fats in your blood, which can increase your risk of heart disease. You are tolerating atorvastatin well, so no changes are needed at this time.  -ECZEMA: Eczema is a condition that makes your skin red and itchy. Your triamcinolone cream is effectively managing your eczema. A refill for the cream has been sent to your pharmacy.  INSTRUCTIONS:  Please follow up in 3 months to recheck your A1c and blood pressure. Make sure to pick up your prescriptions from the pharmacy and take your medications as directed. If you experience any issues or have concerns before your next appointment, please contact our  office.

## 2023-08-22 DIAGNOSIS — Z419 Encounter for procedure for purposes other than remedying health state, unspecified: Secondary | ICD-10-CM | POA: Diagnosis not present

## 2023-09-22 DIAGNOSIS — Z419 Encounter for procedure for purposes other than remedying health state, unspecified: Secondary | ICD-10-CM | POA: Diagnosis not present

## 2023-09-28 ENCOUNTER — Telehealth: Payer: Self-pay | Admitting: Internal Medicine

## 2023-09-28 NOTE — Telephone Encounter (Signed)
 Routing to PCP office for further evaluation- see below  Copied from CRM [id number]. Topic: Clinical - Medication Question >> Sep 28, 2023  1:43 PM Donee H wrote: Reason for CRM: Patient called wanting to know if her medication for cholesterol could be changed to a generic brand due to the cost. Patient was unsure of name but stated medicine cost too much. Callback number [phone]

## 2023-09-28 NOTE — Telephone Encounter (Unsigned)
 Copied from CRM [id number]. Topic: Clinical - Medication Question >> Sep 28, 2023  1:43 PM Donee H wrote: Reason for CRM: Patient called wanting to know if her medication for cholesterol could be changed to a generic brand due to the cost. Patient was unsure of name but stated medicine cost too much. Callback number [phone]

## 2023-10-22 DIAGNOSIS — Z419 Encounter for procedure for purposes other than remedying health state, unspecified: Secondary | ICD-10-CM | POA: Diagnosis not present

## 2023-11-22 DIAGNOSIS — Z419 Encounter for procedure for purposes other than remedying health state, unspecified: Secondary | ICD-10-CM | POA: Diagnosis not present

## 2023-11-25 ENCOUNTER — Telehealth: Payer: Self-pay | Admitting: Internal Medicine

## 2023-11-25 NOTE — Telephone Encounter (Signed)
 Confirmed appt for 8/18

## 2023-11-28 ENCOUNTER — Ambulatory Visit: Attending: Internal Medicine | Admitting: Internal Medicine

## 2023-11-28 ENCOUNTER — Other Ambulatory Visit: Payer: Self-pay

## 2023-11-28 ENCOUNTER — Encounter: Payer: Self-pay | Admitting: Internal Medicine

## 2023-11-28 VITALS — BP 148/75 | HR 61 | Temp 98.3°F | Ht 64.0 in | Wt 162.0 lb

## 2023-11-28 DIAGNOSIS — R4589 Other symptoms and signs involving emotional state: Secondary | ICD-10-CM | POA: Diagnosis not present

## 2023-11-28 DIAGNOSIS — E785 Hyperlipidemia, unspecified: Secondary | ICD-10-CM | POA: Diagnosis not present

## 2023-11-28 DIAGNOSIS — Z87891 Personal history of nicotine dependence: Secondary | ICD-10-CM

## 2023-11-28 DIAGNOSIS — Z79899 Other long term (current) drug therapy: Secondary | ICD-10-CM

## 2023-11-28 DIAGNOSIS — E1159 Type 2 diabetes mellitus with other circulatory complications: Secondary | ICD-10-CM

## 2023-11-28 DIAGNOSIS — E1169 Type 2 diabetes mellitus with other specified complication: Secondary | ICD-10-CM

## 2023-11-28 DIAGNOSIS — Z7984 Long term (current) use of oral hypoglycemic drugs: Secondary | ICD-10-CM

## 2023-11-28 DIAGNOSIS — Z111 Encounter for screening for respiratory tuberculosis: Secondary | ICD-10-CM | POA: Diagnosis not present

## 2023-11-28 DIAGNOSIS — I152 Hypertension secondary to endocrine disorders: Secondary | ICD-10-CM | POA: Diagnosis not present

## 2023-11-28 LAB — POCT GLYCOSYLATED HEMOGLOBIN (HGB A1C): HbA1c, POC (controlled diabetic range): 6.5 % (ref 0.0–7.0)

## 2023-11-28 LAB — GLUCOSE, POCT (MANUAL RESULT ENTRY): POC Glucose: 111 mg/dL — AB (ref 70–99)

## 2023-11-28 MED ORDER — GABAPENTIN 300 MG PO CAPS
300.0000 mg | ORAL_CAPSULE | Freq: Every day | ORAL | 1 refills | Status: DC
  Filled 2023-11-28: qty 90, 90d supply, fill #0

## 2023-11-28 MED ORDER — ATORVASTATIN CALCIUM 20 MG PO TABS
20.0000 mg | ORAL_TABLET | Freq: Every day | ORAL | 1 refills | Status: DC
  Filled 2023-11-28: qty 90, 90d supply, fill #0

## 2023-11-28 NOTE — Patient Instructions (Signed)
  VISIT SUMMARY: Today, we reviewed your diabetes, hypertension, and hyperlipidemia management. Your blood sugar control has improved, and we discussed your current medications and recent changes. We also addressed your feelings of depression and anxiety related to your recent job loss. Additionally, we administered a TB skin test as requested for work purposes.  YOUR PLAN: -TYPE 2 DIABETES MELLITUS: Type 2 diabetes is a condition where your body does not use insulin properly, leading to high blood sugar levels. Your A1c has improved to 6.5%, and your blood sugar levels are stable on metformin  alone. Continue taking metformin  500 mg, two tablets in the morning and one in the evening. We will discontinue glipizide  and monitor your blood sugar levels. A urine test for protein will be ordered, and you should continue with your dietary management and weight loss efforts.  -HYPERTENSION: Hypertension is high blood pressure, which can lead to serious health problems if not managed. Your blood pressure today was 148/75 mmHg, which is above the target. We will recheck your blood pressure today and schedule a follow-up with a clinical pharmacist in one month. Please monitor your blood pressure weekly and record the readings. Continue your current medications (amlodipine , spironolactone , and hydrochlorothiazide ) and limit your salt intake.  -HYPERLIPIDEMIA: Hyperlipidemia is having high levels of fats (lipids) in your blood, which can increase your risk of heart disease. You are currently taking atorvastatin  for this condition. We will send your atorvastatin  prescription to a pharmacy with better pricing to help with the cost.  -MAJOR DEPRESSIVE DISORDER AND PANIC DISORDER: Major depressive disorder and panic disorder are mental health conditions that can affect your mood and anxiety levels. You have restarted sertraline  for these conditions. If your symptoms worsen or if you become open to it, we can discuss a  referral for counseling.  -GENERAL HEALTH MAINTENANCE: We administered a TB skin test today as requested for your work. Continue with your routine health maintenance and lifestyle recommendations.  INSTRUCTIONS: Please follow up with the clinical pharmacist in one month for your blood pressure management. Continue to monitor your blood sugar and blood pressure at home as discussed. If you experience any worsening of your depression or anxiety symptoms, please contact us  to discuss counseling options.                      Contains text generated by [employer].                                 Contains text generated by [employer]bridge.
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
## 2023-11-30 ENCOUNTER — Ambulatory Visit: Payer: Self-pay | Admitting: Internal Medicine

## 2023-11-30 ENCOUNTER — Other Ambulatory Visit: Payer: Self-pay

## 2023-11-30 ENCOUNTER — Ambulatory Visit: Attending: Family Medicine

## 2023-11-30 LAB — MICROALBUMIN / CREATININE URINE RATIO
Creatinine, Urine: 61.4 mg/dL
Microalb/Creat Ratio: 18 mg/g{creat} (ref 0–29)
Microalbumin, Urine: 11 ug/mL

## 2023-11-30 LAB — TB SKIN TEST
Induration: 0 mm
TB Skin Test: NEGATIVE

## 2023-11-30 NOTE — Progress Notes (Signed)
PPD Reading Note  PPD read and results entered in EpicCare.  Result: 0 mm induration.  Interpretation: Negative

## 2023-12-23 DIAGNOSIS — Z419 Encounter for procedure for purposes other than remedying health state, unspecified: Secondary | ICD-10-CM | POA: Diagnosis not present

## 2024-01-09 ENCOUNTER — Telehealth: Payer: Self-pay | Admitting: Internal Medicine

## 2024-01-09 NOTE — Telephone Encounter (Signed)
 Confirmed appt for 9/30

## 2024-01-10 ENCOUNTER — Ambulatory Visit: Attending: Internal Medicine | Admitting: Pharmacist

## 2024-01-10 ENCOUNTER — Other Ambulatory Visit: Payer: Self-pay

## 2024-01-10 ENCOUNTER — Encounter: Payer: Self-pay | Admitting: Pharmacist

## 2024-01-10 ENCOUNTER — Other Ambulatory Visit (HOSPITAL_COMMUNITY): Payer: Self-pay

## 2024-01-10 DIAGNOSIS — R252 Cramp and spasm: Secondary | ICD-10-CM

## 2024-01-10 DIAGNOSIS — F410 Panic disorder [episodic paroxysmal anxiety] without agoraphobia: Secondary | ICD-10-CM

## 2024-01-10 DIAGNOSIS — E1169 Type 2 diabetes mellitus with other specified complication: Secondary | ICD-10-CM | POA: Diagnosis not present

## 2024-01-10 DIAGNOSIS — E1159 Type 2 diabetes mellitus with other circulatory complications: Secondary | ICD-10-CM | POA: Diagnosis not present

## 2024-01-10 DIAGNOSIS — E119 Type 2 diabetes mellitus without complications: Secondary | ICD-10-CM

## 2024-01-10 DIAGNOSIS — E669 Obesity, unspecified: Secondary | ICD-10-CM

## 2024-01-10 DIAGNOSIS — I152 Hypertension secondary to endocrine disorders: Secondary | ICD-10-CM

## 2024-01-10 MED ORDER — METFORMIN HCL ER 500 MG PO TB24
ORAL_TABLET | ORAL | 2 refills | Status: DC
  Filled 2024-01-10: qty 270, 90d supply, fill #0

## 2024-01-10 MED ORDER — METHOCARBAMOL 500 MG PO TABS
500.0000 mg | ORAL_TABLET | Freq: Every day | ORAL | 1 refills | Status: AC | PRN
  Filled 2024-01-10: qty 30, 30d supply, fill #0
  Filled 2024-02-10: qty 30, 30d supply, fill #1
  Filled 2024-03-21: qty 30, 30d supply, fill #2
  Filled 2024-04-17: qty 30, 30d supply, fill #3

## 2024-01-10 MED ORDER — HYDROCHLOROTHIAZIDE 25 MG PO TABS
25.0000 mg | ORAL_TABLET | Freq: Every day | ORAL | 1 refills | Status: AC
  Filled 2024-01-10 – 2024-02-10 (×2): qty 90, 90d supply, fill #0
  Filled 2024-05-10: qty 90, 90d supply, fill #1

## 2024-01-10 MED ORDER — SERTRALINE HCL 50 MG PO TABS
50.0000 mg | ORAL_TABLET | Freq: Every day | ORAL | 1 refills | Status: AC
  Filled 2024-01-10 – 2024-02-10 (×2): qty 90, 90d supply, fill #0
  Filled 2024-04-17: qty 90, 90d supply, fill #1

## 2024-01-10 MED ORDER — AMLODIPINE BESYLATE 10 MG PO TABS
10.0000 mg | ORAL_TABLET | Freq: Every day | ORAL | 1 refills | Status: AC
  Filled 2024-01-10 – 2024-02-10 (×2): qty 90, 90d supply, fill #0
  Filled 2024-05-10 (×2): qty 90, 90d supply, fill #1

## 2024-01-10 NOTE — Progress Notes (Signed)
   S: PCP: Dr. Vicci      Patient arrives in good spirits. Presents to the clinic for hypertension evaluation, counseling, and management. Patient was referred and last seen by Primary Care Provider on 11/28/23.  BP was 148/75 mmHg at that appt.  Today, she is good spirits. Medication adherence reported with amlodipine  and hydrochlorothiazide . She has not taken the spironolactone  due to cost. She endorses lower leg cramps and fatigue. We will check labs today.   Current BP Medications include:  Amlodipine  10 mg daily, HCTZ 25 mg daily, spironolactone  25 mg daily (not taking)  Dietary habits include: compliant with salt restriction; denies drinking excess caffeine  Exercise habits include: walks ~1-2x/week Family / Social history:  -FHx: HTN -Tobacco: former smoker  - Alcohol: endorses occasional use     O:  Vitals:   01/10/24 1423  BP: 134/79   Home BP readings: none  Last 3 Office BP readings: BP Readings from Last 3 Encounters:  01/10/24 134/79  11/28/23 (!) 148/75  07/28/23 (!) 146/76    BMET    Component Value Date/Time   NA 139 10/11/2022 1711   K 4.0 10/11/2022 1711   CL 100 10/11/2022 1711   CO2 27 10/11/2022 1711   GLUCOSE 129 (H) 10/11/2022 1711   GLUCOSE 241 (H) 04/03/2015 1821   BUN 16 10/11/2022 1711   CREATININE 0.96 10/11/2022 1711   CALCIUM  9.9 10/11/2022 1711   GFRNONAA 79 10/23/2019 1529   GFRAA 91 10/23/2019 1529    Renal function: CrCl cannot be calculated (Patient's most recent lab result is older than the maximum 21 days allowed.).  Clinical ASCVD: No  The 10-year ASCVD risk score (Arnett DK, et al., 2019) is: 14.2%   Values used to calculate the score:     Age: 63 years     Clincally relevant sex: Female     Is Non-Hispanic African American: Yes     Diabetic: Yes     Tobacco smoker: No     Systolic Blood Pressure: 134 mmHg     Is BP treated: Yes     HDL Cholesterol: 53 mg/dL     Total Cholesterol: 143 mg/dL   A/P: Hypertension  longstanding currently close to goal on current medications. BP Goal = < 130/80 mmHg. Medication adherence reported with amlodipine  and hydrochlorothiazide . She is no longer taking the spiro. With her BP today, I told her to hold the spironolactone  for now. She will continue the amlodipine  and hydrochlorothiazide  and return to me in 1 month for BP recheck. We will get labs today per PCP. I also sent refills in for her methocarbamol .   -Continued current medications for now. Will recheck in 1 month for reassessment. We can increase HCTZ to 25 mg daily at that time if needed.  -Counseled on lifestyle modifications for blood pressure control including reduced dietary sodium, increased exercise, adequate sleep.  Results reviewed and written information provided.   Total time in face-to-face counseling 15 minutes.   F/U Clinic Visit in 1 month with me.   Erika Nielsen, PharmD, [name], CPP Clinical Pharmacist [facility] & [facility] [phone]

## 2024-01-11 LAB — COMPREHENSIVE METABOLIC PANEL WITH GFR
ALT: 22 IU/L (ref 0–32)
AST: 24 IU/L (ref 0–40)
Albumin: 4.9 g/dL (ref 3.9–4.9)
Alkaline Phosphatase: 79 IU/L (ref 49–135)
BUN/Creatinine Ratio: 15 (ref 12–28)
BUN: 14 mg/dL (ref 8–27)
Bilirubin Total: 0.3 mg/dL (ref 0.0–1.2)
CO2: 23 mmol/L (ref 20–29)
Calcium: 10.4 mg/dL — ABNORMAL HIGH (ref 8.7–10.3)
Chloride: 96 mmol/L (ref 96–106)
Creatinine, Ser: 0.94 mg/dL (ref 0.57–1.00)
Globulin, Total: 2.9 g/dL (ref 1.5–4.5)
Glucose: 162 mg/dL — ABNORMAL HIGH (ref 70–99)
Potassium: 3.9 mmol/L (ref 3.5–5.2)
Sodium: 140 mmol/L (ref 134–144)
Total Protein: 7.8 g/dL (ref 6.0–8.5)
eGFR: 69 mL/min/1.73 (ref >59)

## 2024-01-11 LAB — CBC
Hematocrit: 32.7 % — ABNORMAL LOW (ref 34.0–46.6)
Hemoglobin: 10.5 g/dL — ABNORMAL LOW (ref 11.1–15.9)
MCH: 28.9 pg (ref 26.6–33.0)
MCHC: 32.1 g/dL (ref 31.5–35.7)
MCV: 90 fL (ref 79–97)
Platelets: 294 x10E3/uL (ref 150–450)
RBC: 3.63 x10E6/uL — ABNORMAL LOW (ref 3.77–5.28)
RDW: 13.9 % (ref 11.7–15.4)
WBC: 6.9 x10E3/uL (ref 3.4–10.8)

## 2024-01-11 LAB — LIPID PANEL
Chol/HDL Ratio: 2.5 ratio (ref 0.0–4.4)
Cholesterol, Total: 128 mg/dL (ref 100–199)
HDL: 51 mg/dL (ref >39)
LDL Chol Calc (NIH): 56 mg/dL (ref 0–99)
Triglycerides: 115 mg/dL (ref 0–149)
VLDL Cholesterol Cal: 21 mg/dL (ref 5–40)

## 2024-01-12 ENCOUNTER — Other Ambulatory Visit: Payer: Self-pay

## 2024-01-25 ENCOUNTER — Encounter: Payer: Self-pay | Admitting: Internal Medicine

## 2024-02-10 ENCOUNTER — Other Ambulatory Visit: Payer: Self-pay

## 2024-02-10 ENCOUNTER — Encounter: Payer: Self-pay | Admitting: Pharmacist

## 2024-02-10 ENCOUNTER — Ambulatory Visit: Attending: Family Medicine | Admitting: Pharmacist

## 2024-02-10 VITALS — BP 165/68

## 2024-02-10 DIAGNOSIS — I10 Essential (primary) hypertension: Secondary | ICD-10-CM

## 2024-02-10 NOTE — Progress Notes (Signed)
   S: PCP: Dr. Vicci      Patient arrives in good spirits. Presents to the clinic for hypertension evaluation, counseling, and management. Patient was referred and last seen by Primary Care Provider on 11/28/23.  BP was 148/75 mmHg at that appt.  Today, she is good spirits. I saw her 01/10/24 and BP was good. I had her hold off on the spironolactone  and continue with the amlodipine  + hydrochlorothiazide . She had only been taking hydrochlorothiazide  and amlodipine  prior to that visit.   Unfortunately, she ran out of hydrochlorothiazide  and amlodipine  several weeks ago. Has not taken anything today and her BP is elevated.  Current BP Medications include:  Amlodipine  10 mg daily, HCTZ 25 mg daily  Dietary habits include: compliant with salt restriction; denies drinking excess caffeine  Exercise habits include: walks ~1-2x/week Family / Social history:  -FHx: HTN -Tobacco: former smoker  - Alcohol: endorses occasional use    O:  Vitals:   02/10/24 1614  BP: (!) 165/68    Home BP readings: none  Last 3 Office BP readings: BP Readings from Last 3 Encounters:  02/10/24 (!) 165/68  01/10/24 134/79  11/28/23 (!) 148/75    BMET    Component Value Date/Time   NA 140 01/10/2024 1427   K 3.9 01/10/2024 1427   CL 96 01/10/2024 1427   CO2 23 01/10/2024 1427   GLUCOSE 162 (H) 01/10/2024 1427   GLUCOSE 241 (H) 04/03/2015 1821   BUN 14 01/10/2024 1427   CREATININE 0.94 01/10/2024 1427   CALCIUM  10.4 (H) 01/10/2024 1427   GFRNONAA 79 10/23/2019 1529   GFRAA 91 10/23/2019 1529    Renal function: CrCl cannot be calculated (Patient's most recent lab result is older than the maximum 21 days allowed.).  Clinical ASCVD: No  The ASCVD Risk score (Arnett DK, et al., 2019) failed to calculate for the following reasons:   The valid total cholesterol range is 130 to 320 mg/dL   A/P: Hypertension longstanding currently above goal. BP Goal = < 130/80 mmHg. Medication adherence is  suboptimal due to cost. I educated her about Medicaid benefits and our pharmacy. She does not have the money to pay but can charge her copays to an account if needed. I collaborated with pharmacy and will have her refill her amlodipine  and hydrochlorothiazide  today.  -Resume amlodipine  and hydrochlorothiazide .  -Collaborated with pharmacy to fill these today.  -Counseled on lifestyle modifications for blood pressure control including reduced dietary sodium, increased exercise, adequate sleep.  Results reviewed and written information provided.   Total time in face-to-face counseling 15 minutes.   F/U Clinic Visit in 1 month with PCP.   Herlene Fleeta Morris, PharmD, [name], CPP Clinical Pharmacist [facility] & [facility] [phone]   Herlene Fleeta Morris, PharmD, [place], CPP Clinical Pharmacist [facility] & [facility] [phone]

## 2024-02-16 ENCOUNTER — Telehealth: Payer: Self-pay | Admitting: Internal Medicine

## 2024-02-16 ENCOUNTER — Telehealth: Payer: Self-pay

## 2024-02-16 DIAGNOSIS — Z1231 Encounter for screening mammogram for malignant neoplasm of breast: Secondary | ICD-10-CM

## 2024-02-16 NOTE — Telephone Encounter (Signed)
MMG order placed.

## 2024-02-16 NOTE — Telephone Encounter (Addendum)
 Copied from CRM [id number]. Topic: Clinical - Request for Lab/Test Order >> Feb 16, 2024 10:35 AM Pinkey ORN wrote:  Reason for CRM: Requested Order  >> Feb 16, 2024 10:36 AM Pinkey ORN wrote:  Patient is requesting to have a mammogram completed. Please follow up with patient.

## 2024-02-16 NOTE — Addendum Note (Signed)
 Addended by: VICCI SOBER B on: 02/16/2024 06:44 PM   Modules accepted: Orders

## 2024-02-16 NOTE — Telephone Encounter (Signed)
 Copied from CRM [id number]. Topic: Referral - Question >> Feb 16, 2024 10:39 AM Pinkey ORN wrote: Patient is requesting to be referred over to a Gynecologist that accepts her insurance and is located in [place]. Please follow up with patient.

## 2024-02-16 NOTE — Telephone Encounter (Signed)
 Please advise. and I can contact the patient to advise about the mobile screening clinic. Thank you.

## 2024-02-16 NOTE — Telephone Encounter (Signed)
What is the referral for? 

## 2024-02-17 NOTE — Telephone Encounter (Signed)
 Called & spoke to the patient. Verified name & DOB. Inquired reason for referral. Patient stated that there is still a painful cyst / mass in her uterus on the left side. Intermittently flares-up. Please advise next steps.

## 2024-02-17 NOTE — Telephone Encounter (Signed)
 Called & spoke to the patient. Verified name & DOB. Informed that requested referral has been placed and the specialist will give her a call to scheduled an appointment. Patient expressed verbal understanding.

## 2024-02-18 NOTE — Telephone Encounter (Signed)
 Needs visit with me to eval her concerns further and decide whether we need to do ultra sound before referring to GYN.

## 2024-02-20 NOTE — Telephone Encounter (Signed)
 Called but no answer. LVM informing patient to call back and schedule an in-person appointment.

## 2024-02-20 NOTE — Telephone Encounter (Signed)
 Noted! Thank you

## 2024-02-28 ENCOUNTER — Ambulatory Visit: Attending: Internal Medicine | Admitting: Internal Medicine

## 2024-02-28 ENCOUNTER — Encounter: Payer: Self-pay | Admitting: Internal Medicine

## 2024-02-28 ENCOUNTER — Other Ambulatory Visit: Payer: Self-pay

## 2024-02-28 VITALS — BP 152/72 | HR 54 | Wt 165.8 lb

## 2024-02-28 DIAGNOSIS — M7918 Myalgia, other site: Secondary | ICD-10-CM

## 2024-02-28 DIAGNOSIS — R102 Pelvic and perineal pain unspecified side: Secondary | ICD-10-CM | POA: Diagnosis not present

## 2024-02-28 DIAGNOSIS — Z7984 Long term (current) use of oral hypoglycemic drugs: Secondary | ICD-10-CM

## 2024-02-28 DIAGNOSIS — Z23 Encounter for immunization: Secondary | ICD-10-CM

## 2024-02-28 DIAGNOSIS — E1159 Type 2 diabetes mellitus with other circulatory complications: Secondary | ICD-10-CM | POA: Diagnosis not present

## 2024-02-28 DIAGNOSIS — Z7902 Long term (current) use of antithrombotics/antiplatelets: Secondary | ICD-10-CM | POA: Diagnosis not present

## 2024-02-28 DIAGNOSIS — E1169 Type 2 diabetes mellitus with other specified complication: Secondary | ICD-10-CM

## 2024-02-28 DIAGNOSIS — I152 Hypertension secondary to endocrine disorders: Secondary | ICD-10-CM | POA: Diagnosis not present

## 2024-02-28 DIAGNOSIS — Z79899 Other long term (current) drug therapy: Secondary | ICD-10-CM

## 2024-02-28 LAB — GLUCOSE, POCT (MANUAL RESULT ENTRY): POC Glucose: 163 mg/dL — AB (ref 70–99)

## 2024-02-28 LAB — POCT GLYCOSYLATED HEMOGLOBIN (HGB A1C): HbA1c, POC (controlled diabetic range): 6 % (ref 0.0–7.0)

## 2024-02-28 MED ORDER — SPIRONOLACTONE 25 MG PO TABS
12.5000 mg | ORAL_TABLET | Freq: Every day | ORAL | 3 refills | Status: AC
  Filled 2024-02-28: qty 45, 90d supply, fill #0
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
## 2024-02-28 NOTE — Patient Instructions (Signed)
  VISIT SUMMARY: Today, we discussed your concerns about an ovarian cyst and soreness from a recent fall. We also reviewed your blood pressure management.  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 An ovarian cyst is a fluid-filled sac within the ovary. We will order a pelvic ultra sound.   -LEFT HIP CONTUSION AND PAIN AFTER FALL: You have a bruise and soreness in your left hip from a fall in October. A contusion is a bruise caused by a direct blow or impact. To help with the pain, use over-the-counter Icy Hot rub twice daily for one week.  -HYPERTENSION, UNCONTROLLED: Your blood pressure is currently higher than normal. Hypertension is when the force of the blood against your artery walls is too high. We have restarted your medication, spironolactone , at 25 mg, half a tablet daily. The prescription has been sent to your pharmacy.  INSTRUCTIONS: Please follow up with us  if your symptoms persist or worsen. Make sure to take your blood pressure medication as prescribed and monitor your blood pressure regularly.                      Contains text generated by Abridge.                                 Contains text generated by Abridge.

## 2024-03-02 ENCOUNTER — Other Ambulatory Visit: Payer: Self-pay | Admitting: Internal Medicine

## 2024-03-02 DIAGNOSIS — E1169 Type 2 diabetes mellitus with other specified complication: Secondary | ICD-10-CM

## 2024-03-02 DIAGNOSIS — E669 Obesity, unspecified: Secondary | ICD-10-CM

## 2024-03-02 NOTE — Telephone Encounter (Unsigned)
 Copied from CRM [id number]. Topic: Clinical - Medication Refill >> Mar 02, 2024 11:33 AM Hadassah PARAS wrote: Medication: atorvastatin  (LIPITOR) 20 MG tablet; gabapentin  (NEURONTIN ) 300 MG capsule; metFORMIN  (GLUCOPHAGE -XR) 500 MG 24 hr tablet    Has the patient contacted their pharmacy? No (Agent: If no, request that the patient contact the pharmacy for the refill. If patient does not wish to contact the pharmacy document the reason why and proceed with request.) (Agent: If yes, when and what did the pharmacy advise?)  This is the patient's preferred pharmacy:    [facility] [address], [address] [place] [address] Phone: [phone] Fax: [phone]  Is this the correct pharmacy for this prescription? Yes If no, delete pharmacy and type the correct one.   Has the prescription been filled recently? Yes  Is the patient out of the medication? No  Has the patient been seen for an appointment in the last year OR does the patient have an upcoming appointment? Yes  Can we respond through MyChart? No  Agent: Please be advised that Rx refills may take up to 3 business days. We ask that you follow-up with your pharmacy.

## 2024-03-04 MED ORDER — ATORVASTATIN CALCIUM 20 MG PO TABS
20.0000 mg | ORAL_TABLET | Freq: Every day | ORAL | 1 refills | Status: AC
  Filled 2024-03-04: qty 90, 90d supply, fill #0

## 2024-03-04 MED ORDER — METFORMIN HCL ER 500 MG PO TB24
ORAL_TABLET | ORAL | 2 refills | Status: AC
  Filled 2024-03-04: qty 270, 90d supply, fill #0

## 2024-03-04 MED ORDER — GABAPENTIN 300 MG PO CAPS
300.0000 mg | ORAL_CAPSULE | Freq: Every day | ORAL | 1 refills | Status: AC
  Filled 2024-03-04: qty 90, 90d supply, fill #0

## 2024-03-04 NOTE — Telephone Encounter (Signed)
 Per chart- Rx is present- but not confirmed received- will resend Requested Prescriptions  Pending Prescriptions Disp Refills   gabapentin  (NEURONTIN ) 300 MG capsule 90 capsule 1    Sig: Take 1 capsule (300 mg total) by mouth at bedtime.     Neurology: Anticonvulsants - gabapentin  Passed - 03/04/2024  9:56 AM      Passed - Cr in normal range and within 360 days    Creatinine, Ser  Date Value Ref Range Status  01/10/2024 0.94 0.57 - 1.00 mg/dL Final         Passed - Completed PHQ-2 or PHQ-9 in the last 360 days      Passed - Valid encounter within last 12 months    Recent Outpatient Visits           5 days ago Pelvic pain   [place] [facility] Dept Of [place]. [facility] Erika Barnie NOVAK, MD   3 weeks ago Essential hypertension   [place] [facility] [place] - A Dept Of [place]. [facility] Fleeta Morris, [place] L, RPH-CPP   1 month ago Hypertension associated with type 2 diabetes mellitus [facility])   [place] [facility] - A Dept Of [place]. [facility] Fleeta Morris, [place] L, RPH-CPP   3 months ago Type 2 diabetes mellitus with other specified complication, without long-term current use of insulin (HCC)   [place] [facility] - A Dept Of [place]. [facility] Erika Barnie B, MD   7 months ago Type 2 diabetes mellitus with obesity   [place] [facility] [place] - A Dept Of . [facility] Erika Barnie NOVAK, MD       Future Appointments             In 3 weeks Erika Barnie NOVAK, MD [facility] [place] - A Dept Of [place] DEL. [facility] [address]             metFORMIN  (GLUCOPHAGE -XR) 500 MG 24 hr tablet 270 tablet 2    Sig: Take 2 tablets (1,000 mg total) by mouth in the morning AND 1 tablet (500 mg total) every evening.     Endocrinology:  Diabetes - Biguanides Failed - 03/04/2024  9:56 AM      Failed - B12 Level in normal range and within  720 days    Vitamin B-12  Date Value Ref Range Status  02/23/2017 1,591 (H) 232 - 1,245 pg/mL Final         Failed - CBC within normal limits and completed in the last 12 months    WBC  Date Value Ref Range Status  01/10/2024 6.9 3.4 - 10.8 x10E3/uL Final  04/03/2015 6.7 4.0 - 10.5 K/uL Final   RBC  Date Value Ref Range Status  01/10/2024 3.63 (L) 3.77 - 5.28 x10E6/uL Final  04/03/2015 4.28 3.87 - 5.11 MIL/uL Final   Hemoglobin  Date Value Ref Range Status  01/10/2024 10.5 (L) 11.1 - 15.9 g/dL Final   Hematocrit  Date Value Ref Range Status  01/10/2024 32.7 (L) 34.0 - 46.6 % Final   MCHC  Date Value Ref Range Status  01/10/2024 32.1 31.5 - 35.7 g/dL Final  87/77/7983 67.8 30.0 - 36.0 g/dL Final   [facility]  Date Value Ref Range Status  01/10/2024 28.9 26.6 - 33.0 pg Final  04/03/2015 27.1 26.0 - 34.0 pg Final   MCV  Date  Value Ref Range Status  01/10/2024 90 79 - 97 fL Final   No results found for: PLTCOUNTKUC, LABPLAT, POCPLA RDW  Date Value Ref Range Status  01/10/2024 13.9 11.7 - 15.4 % Final         Passed - Cr in normal range and within 360 days    Creatinine, Ser  Date Value Ref Range Status  01/10/2024 0.94 0.57 - 1.00 mg/dL Final         Passed - HBA1C is between 0 and 7.9 and within 180 days    HbA1c, POC (controlled diabetic range)  Date Value Ref Range Status  02/28/2024 6.0 0.0 - 7.0 % Final         Passed - eGFR in normal range and within 360 days    GFR calc Af Amer  Date Value Ref Range Status  10/23/2019 91 >59 mL/min/1.73 Final    Comment:    **[facility] currently reports eGFR in compliance with the current**   recommendations of the [employer]. [facility] will   update reporting as new guidelines are published from the NKF-ASN   Task force.    GFR calc non Af Amer  Date Value Ref Range Status  10/23/2019 79 >59 mL/min/1.73 Final   eGFR  Date Value Ref Range Status  01/10/2024 69 >59 mL/min/1.73 Final          Passed - Valid encounter within last 6 months    Recent Outpatient Visits           5 days ago Pelvic pain   [place] [facility] [place] - [facility] Dept Of [place]. [facility] Erika Barnie NOVAK, MD   3 weeks ago Essential hypertension   [place] [facility] [place] - A Dept Of [place]. [facility] Fleeta Morris, [place] L, RPH-CPP   1 month ago Hypertension associated with type 2 diabetes mellitus [facility])   [place] [facility] - A Dept Of [place]. [facility] Fleeta Morris, [place] L, RPH-CPP   3 months ago Type 2 diabetes mellitus with other specified complication, without long-term current use of insulin (HCC)    [place] [facility] - A Dept Of [place]. [facility] Erika Barnie B, MD   7 months ago Type 2 diabetes mellitus with obesity   [place] [facility] [place] - A Dept Of . [facility] Erika Barnie NOVAK, MD       Future Appointments             In 3 weeks Erika Barnie NOVAK, MD [facility] [place] - A Dept Of [place] DEL. [facility] [address]             atorvastatin  (LIPITOR) 20 MG tablet 90 tablet 1    Sig: Take 1 tablet (20 mg total) by mouth daily.     Cardiovascular:  Antilipid - Statins Failed - 03/04/2024  9:56 AM      Failed - Lipid Panel in normal range within the last 12 months    Cholesterol, Total  Date Value Ref Range Status  01/10/2024 128 100 - 199 mg/dL Final   LDL Chol Calc (NIH)  Date Value Ref Range Status  01/10/2024 56 0 - 99 mg/dL Final   HDL  Date Value Ref Range Status  01/10/2024 51 >39 mg/dL Final   Triglycerides  Date Value Ref Range Status  01/10/2024 115 0 - 149 mg/dL Final  Passed - Patient is not pregnant      Passed - Valid encounter within last 12 months    Recent Outpatient Visits           5 days ago Pelvic pain   [place] [facility] [place] - A Dept Of [place]. [facility] Erika Barnie NOVAK, MD   3 weeks ago Essential hypertension   [place] [facility] [place] - A Dept Of [place]. [facility] Fleeta Morris, [place] L, RPH-CPP   1 month ago Hypertension associated with type 2 diabetes mellitus [facility])   [place] [facility] - A Dept Of [place]. [facility] Fleeta Morris, [place] L, RPH-CPP   3 months ago Type 2 diabetes mellitus with other specified complication, without long-term current use of insulin (HCC)   [place] [facility] - A Dept Of [place]. [facility] Erika Barnie B, MD   7 months ago Type 2 diabetes mellitus with obesity   [place] [facility] [place] - A Dept Of [place]. [facility] Erika Barnie NOVAK, MD       Future Appointments             In 3 weeks Erika Barnie NOVAK, MD [facility] [place] - A Dept Of [place] DEL. [facility] [place]

## 2024-03-05 ENCOUNTER — Other Ambulatory Visit: Payer: Self-pay

## 2024-03-05 ENCOUNTER — Telehealth: Payer: Self-pay | Admitting: Internal Medicine

## 2024-03-05 NOTE — Telephone Encounter (Signed)
 Copied from CRM [id number]. Topic: Clinical - Request for Lab/Test Order >> Mar 05, 2024 10:30 AM Winona SAUNDERS wrote:  [facility] calling from [facility] [place] [facility] calling to verify order US  Pelvis Complete (Order [id number]) and if it should be W/ OR WO TRANSVAGINAL/BG

## 2024-03-05 NOTE — Telephone Encounter (Signed)
 Complete with transvaginal.

## 2024-03-06 NOTE — Telephone Encounter (Signed)
 Called & spoke to Desire at [facility]. Informed Desire that per Dr.Johnson the ultrasound needs to be complete with transvaginal. Desire expressed verbal understanding and will let [facility] know for review and schedule the patient an appointment. No further assistance needed at this time.

## 2024-03-07 ENCOUNTER — Other Ambulatory Visit: Payer: Self-pay

## 2024-03-21 ENCOUNTER — Other Ambulatory Visit: Payer: Self-pay | Admitting: Internal Medicine

## 2024-03-21 ENCOUNTER — Inpatient Hospital Stay: Admission: RE | Admit: 2024-03-21 | Discharge: 2024-03-21 | Attending: Internal Medicine | Admitting: Internal Medicine

## 2024-03-21 ENCOUNTER — Other Ambulatory Visit: Payer: Self-pay

## 2024-03-21 DIAGNOSIS — R1021 Pelvic and perineal pain right side: Secondary | ICD-10-CM | POA: Diagnosis not present

## 2024-03-21 DIAGNOSIS — L309 Dermatitis, unspecified: Secondary | ICD-10-CM

## 2024-03-21 DIAGNOSIS — R102 Pelvic and perineal pain unspecified side: Secondary | ICD-10-CM

## 2024-03-22 ENCOUNTER — Other Ambulatory Visit: Payer: Self-pay

## 2024-03-22 MED ORDER — TRIAMCINOLONE ACETONIDE 0.1 % EX OINT
1.0000 | TOPICAL_OINTMENT | Freq: Two times a day (BID) | CUTANEOUS | 1 refills | Status: AC
  Filled 2024-03-22: qty 454, 34d supply, fill #0

## 2024-03-23 DIAGNOSIS — Z419 Encounter for procedure for purposes other than remedying health state, unspecified: Secondary | ICD-10-CM | POA: Diagnosis not present

## 2024-03-26 ENCOUNTER — Ambulatory Visit: Payer: Self-pay | Admitting: Internal Medicine

## 2024-03-27 NOTE — Telephone Encounter (Signed)
 Copied from CRM #[id number]. Topic: Clinical - Lab/Test Results >> Mar 27, 2024  2:46 PM Erika Nielsen wrote:  Pt returning call for imaging results. E2C2 triage and specialist can not relate imaging results. CAL requested CRM be sent to Admin pool

## 2024-03-29 ENCOUNTER — Encounter: Payer: Self-pay | Admitting: Internal Medicine

## 2024-03-29 ENCOUNTER — Ambulatory Visit: Attending: Internal Medicine | Admitting: Internal Medicine

## 2024-03-29 VITALS — BP 137/67 | HR 75 | Ht 64.0 in | Wt 157.0 lb

## 2024-03-29 DIAGNOSIS — Z7984 Long term (current) use of oral hypoglycemic drugs: Secondary | ICD-10-CM

## 2024-03-29 DIAGNOSIS — I10 Essential (primary) hypertension: Secondary | ICD-10-CM | POA: Diagnosis not present

## 2024-03-29 DIAGNOSIS — E785 Hyperlipidemia, unspecified: Secondary | ICD-10-CM

## 2024-03-29 DIAGNOSIS — E1159 Type 2 diabetes mellitus with other circulatory complications: Secondary | ICD-10-CM

## 2024-03-29 DIAGNOSIS — R102 Pelvic and perineal pain unspecified side: Secondary | ICD-10-CM | POA: Diagnosis not present

## 2024-03-29 DIAGNOSIS — I152 Hypertension secondary to endocrine disorders: Secondary | ICD-10-CM

## 2024-03-29 DIAGNOSIS — E1169 Type 2 diabetes mellitus with other specified complication: Secondary | ICD-10-CM

## 2024-03-29 DIAGNOSIS — E119 Type 2 diabetes mellitus without complications: Secondary | ICD-10-CM

## 2024-03-29 NOTE — Patient Instructions (Signed)
°  VISIT SUMMARY: Today, we reviewed your chronic conditions, including diabetes, hypertension, hyperlipidemia, and anemia. We also discussed your intermittent pelvic pain and general health maintenance.  YOUR PLAN: -TYPE 2 DIABETES MELLITUS: Your diabetes is well-controlled with an A1c of 6.0 and blood glucose levels generally under 200 mg/dL. Continue taking metformin  1000 mg in the morning and 500 mg in the evening. Please maintain a log of your blood glucose readings, continue your healthy eating habits, and keep up with your physical activity.  -HYPERTENSION: Your blood pressure is not well-controlled, with a current reading of 146/72 mmHg, which improved to 137/67 mmHg upon recheck. Hypertension means high blood pressure. Please obtain batteries for your blood pressure device and check your blood pressure once a week. Record your readings and bring them to your next appointment. We have scheduled a follow-up in six weeks for a blood pressure recheck with the clinical pharmacist.  -HYPERLIPIDEMIA: Your hyperlipidemia, which means high cholesterol, is well-managed with atorvastatin . Continue taking atorvastatin  20 mg daily.  -ANEMIA: Your anemia is mild but stable. Anemia means you have a lower than normal number of red blood cells. We will check your blood count and iron level at your next visit.  -PELVIC PAIN: You have intermittent sharp pain in your left pelvic area, and a previous ultrasound showed a small fibroid. A fibroid is a non-cancerous growth in the uterus. Since the pain is not frequent, we will continue to monitor it. If the pain becomes more frequent, we will consider a CT scan of your pelvis.  -GENERAL HEALTH MAINTENANCE: Your mammogram is scheduled for December 26th. 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  INSTRUCTIONS: Please follow up in six weeks for a blood pressure recheck with the clinical pharmacist. Also,  proceed with your scheduled mammogram on December 26th.                      Contains text generated by Abridge.                                 Contains text generated by Abridge.
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
## 2024-04-06 ENCOUNTER — Ambulatory Visit
Admission: RE | Admit: 2024-04-06 | Discharge: 2024-04-06 | Disposition: A | Discharge status: Home / Self Care | Source: Ambulatory Visit | Attending: Internal Medicine | Admitting: Internal Medicine

## 2024-04-06 DIAGNOSIS — Z1231 Encounter for screening mammogram for malignant neoplasm of breast: Secondary | ICD-10-CM

## 2024-04-11 ENCOUNTER — Ambulatory Visit: Payer: Self-pay | Admitting: Internal Medicine

## 2024-04-18 ENCOUNTER — Other Ambulatory Visit: Payer: Self-pay

## 2024-05-10 ENCOUNTER — Ambulatory Visit: Payer: Self-pay | Admitting: Pharmacist

## 2024-05-10 ENCOUNTER — Other Ambulatory Visit: Payer: Self-pay

## 2024-05-17 NOTE — Progress Notes (Unsigned)
" ° °  S: PCP: Dr. Vicci      Patient arrives in good spirits. Presents to the clinic for hypertension evaluation, counseling, and management. Patient was referred on 11/28/23 and last seen by Primary Care Provider on 03/29/24.  BP was 137/67 mmHg at that appt.  At last appointment with pharmacy on 02/10/24, she had ran out of her hydrochlorothiazide  and amlodipine  due to affordability concerns. She did not have money to pay her copays, but was provided the option to put those copays on a charge account. She was instructed to resume both amlodipine  and hydrochlorothiazide  after collaboration with the pharmacy to ensure copays would be placed on an AR account.   At today's visit, *** Check to see if she restarted spironolactone  and verify dose that she is taking Fill history looks OK, see if she is having any difficulties with affordability Can we provide her with batteries for her BP cuff at home?  Follow-up in June 2026? Would have F/u every 2 months between PharmD and PCP, if BP is relatively controlled.   Current BP Medications include:   Amlodipine  10 mg daily HCTZ 25 mg daily Spiro? ***  Dietary habits include:  Compliant with salt restriction; denies drinking excess caffeine   Exercise habits include: walks ~1-2x/week  Family / Social history:  FHx: HTN Tobacco: former smoker   Alcohol: endorses occasional use    O:  There were no vitals filed for this visit.  Home BP readings: ***  Last 3 Office BP readings: BP Readings from Last 3 Encounters:  03/29/24 137/67  02/28/24 (!) 152/72  02/10/24 (!) 165/68   BMET    Component Value Date/Time   NA 140 01/10/2024 1427   K 3.9 01/10/2024 1427   CL 96 01/10/2024 1427   CO2 23 01/10/2024 1427   GLUCOSE 162 (H) 01/10/2024 1427   GLUCOSE 241 (H) 04/03/2015 1821   BUN 14 01/10/2024 1427   CREATININE 0.94 01/10/2024 1427   CALCIUM  10.4 (H) 01/10/2024 1427   GFRNONAA 79 10/23/2019 1529   GFRAA 91 10/23/2019 1529   Renal  function: CrCl cannot be calculated (Patient's most recent lab result is older than the maximum 21 days allowed.).  Clinical ASCVD: No  The ASCVD Risk score (Arnett DK, et al., 2019) failed to calculate for the following reasons:   The valid total cholesterol range is 130 to 320 mg/dL   A/P: Hypertension longstanding currently *** at/above goal. BP Goal < 130/80 mmHg. Medication adherence is suboptimal due to cost ***.  *** Counseled on lifestyle modifications for blood pressure control including reduced dietary sodium, increased exercise, adequate sleep.  Results reviewed and written information provided.   Total time in face-to-face counseling *** minutes.    Follow-up:  PCP visit: *** PharmD visit: ***  ***  Herlene Fleeta Morris, PharmD, BCACP, CPP Clinical Pharmacist [facility] [phone]  "

## 2024-05-24 ENCOUNTER — Ambulatory Visit: Admitting: Pharmacist

## 2024-07-30 ENCOUNTER — Ambulatory Visit: Payer: Self-pay | Admitting: Internal Medicine
